# Patient Record
Sex: Female | Born: 1970
Health system: Southern US, Community
[De-identification: ages and names within clinical notes are randomized; demographics above are authoritative.]

## PROBLEM LIST (undated history)

## (undated) DIAGNOSIS — N920 Excessive and frequent menstruation with regular cycle: Secondary | ICD-10-CM

## (undated) DIAGNOSIS — N8003 Adenomyosis of the uterus: Secondary | ICD-10-CM

## (undated) DIAGNOSIS — N8000 Endometriosis of the uterus, unspecified: Secondary | ICD-10-CM

## (undated) DIAGNOSIS — E119 Type 2 diabetes mellitus without complications: Secondary | ICD-10-CM

## (undated) DIAGNOSIS — D509 Iron deficiency anemia, unspecified: Secondary | ICD-10-CM

## (undated) HISTORY — PX: FOOT SURGERY: SHX648

---

## 2011-06-02 ENCOUNTER — Ambulatory Visit (INDEPENDENT_AMBULATORY_CARE_PROVIDER_SITE_OTHER): Payer: BC Managed Care – HMO | Admitting: Obstetrics and Gynecology

## 2011-06-02 DIAGNOSIS — Z01419 Encounter for gynecological examination (general) (routine) without abnormal findings: Secondary | ICD-10-CM

## 2011-08-02 ENCOUNTER — Telehealth: Payer: Self-pay | Admitting: Obstetrics and Gynecology

## 2011-08-02 NOTE — Telephone Encounter (Signed)
Routed to triage/laura/?

## 2011-08-04 ENCOUNTER — Telehealth: Payer: Self-pay | Admitting: Obstetrics and Gynecology

## 2011-08-04 DIAGNOSIS — N979 Female infertility, unspecified: Secondary | ICD-10-CM

## 2011-08-04 NOTE — Telephone Encounter (Signed)
Pt called wanting an appt to see specialist that SR had recommended. Pt had consult with SR for reanastamosis and AMH.  Referral made to Dr Elesa Hacker.  Will call to make sure referral is r/c. Pt will call to check with them concening benefits. ld

## 2011-08-04 NOTE — Telephone Encounter (Signed)
Routed to laura/cld once/laura returned call

## 2011-08-05 NOTE — Telephone Encounter (Signed)
Please pull chart.

## 2011-09-09 DIAGNOSIS — I10 Essential (primary) hypertension: Secondary | ICD-10-CM | POA: Insufficient documentation

## 2011-09-09 DIAGNOSIS — E119 Type 2 diabetes mellitus without complications: Secondary | ICD-10-CM | POA: Insufficient documentation

## 2011-12-24 ENCOUNTER — Telehealth: Payer: Self-pay | Admitting: Obstetrics and Gynecology

## 2011-12-24 NOTE — Telephone Encounter (Signed)
SR pt 

## 2011-12-27 NOTE — Telephone Encounter (Signed)
LMTC @5  ld

## 2012-01-07 ENCOUNTER — Telehealth: Payer: Self-pay

## 2012-01-07 NOTE — Telephone Encounter (Signed)
Pt notified of cost breakdown of tubal reversal.  Pt also requesting payment options for the hospital.  Told to call Britta Mccreedy or Pam at [facility] for information.  Pt also asking if she needs to keep appointment with Dr Elesa Hacker before going through with reversal.  Will send chart to SR office for review.  ld

## 2012-01-19 NOTE — Telephone Encounter (Signed)
Patient is not a candidate for tubal reversal because of low AMH which is associated with poor egg reserve. Pt was referred to Dr Elesa Hacker to review her options at this time.

## 2012-01-21 ENCOUNTER — Telehealth: Payer: Self-pay

## 2012-01-21 NOTE — Telephone Encounter (Signed)
LM for pt that she is to f/u with Dr Elesa Hacker due to not being a good candidate for tubal reversal.  ld

## 2012-01-24 ENCOUNTER — Telehealth: Payer: Self-pay

## 2012-01-25 NOTE — Telephone Encounter (Signed)
Pt notified of AMH result.  ld

## 2013-09-05 ENCOUNTER — Encounter (HOSPITAL_COMMUNITY)
Admission: RE | Admit: 2013-09-05 | Discharge: 2013-09-05 | Disposition: A | Discharge status: Home / Self Care | Payer: BC Managed Care – PPO | Source: Ambulatory Visit | Attending: Obstetrics and Gynecology | Admitting: Obstetrics and Gynecology

## 2013-09-05 ENCOUNTER — Encounter (HOSPITAL_COMMUNITY): Payer: Self-pay

## 2013-09-05 DIAGNOSIS — Z01812 Encounter for preprocedural laboratory examination: Secondary | ICD-10-CM | POA: Insufficient documentation

## 2013-09-05 DIAGNOSIS — Z01810 Encounter for preprocedural cardiovascular examination: Secondary | ICD-10-CM | POA: Insufficient documentation

## 2013-09-05 LAB — BASIC METABOLIC PANEL WITH GFR
BUN: 9 mg/dL (ref 6–23)
CO2: 26 meq/L (ref 19–32)
Calcium: 9.1 mg/dL (ref 8.4–10.5)
Chloride: 92 meq/L — ABNORMAL LOW (ref 96–112)
Creatinine, Ser: 0.56 mg/dL (ref 0.50–1.10)
GFR calc Af Amer: >90 mL/min
GFR calc non Af Amer: >90 mL/min
Glucose, Bld: 321 mg/dL — ABNORMAL HIGH (ref 70–99)
Potassium: 3.9 meq/L (ref 3.7–5.3)
Sodium: 132 meq/L — ABNORMAL LOW (ref 137–147)

## 2013-09-05 LAB — CBC
HCT: 35 % — ABNORMAL LOW (ref 36.0–46.0)
Hemoglobin: 10 g/dL — ABNORMAL LOW (ref 12.0–15.0)
MCH: 21.6 pg — AB (ref 26.0–34.0)
MCHC: 28.6 g/dL — AB (ref 30.0–36.0)
MCV: 75.8 fL — AB (ref 78.0–100.0)
PLATELETS: 436 10*3/uL — AB (ref 150–400)
RBC: 4.62 MIL/uL (ref 3.87–5.11)
RDW: 22.1 % — AB (ref 11.5–15.5)
WBC: 6.3 10*3/uL (ref 4.0–10.5)

## 2013-09-05 NOTE — Patient Instructions (Addendum)
Your procedure is scheduled on: 09/11/13    Enter through the Main Entrance of [facility] at:6am Pick up the phone at the desk and dial [phone] and inform us of your arrival.  Please call this number if you have any problems the morning of surgery: [phone]  Remember: Do not eat food after midnight:Monday Take these medicines the morning of surgery with a SIP OF WATER:Lisinopril  Do not wear jewelry, make-up, or FINGER nail polish No metal in your hair or on your body. Do not wear lotions, powders, perfumes.  You may wear deodorant.  Do not bring valuables to the hospital. Contacts, dentures or bridgework may not be worn into surgery.  Leave suitcase in the car. After Surgery it may be brought to your room. For patients being admitted to the hospital, checkout time is 11:00am the day of discharge.    Patients discharged on the day of surgery will not be allowed to drive home.      Remember: Do not eat food or drink liquids, including water, after midnight:Monday   You may brush your teeth the morning of surgery.

## 2013-09-06 NOTE — H&P (Signed)
NAME:  [name] [name], [name] [name] NO.:  MEDICAL RECORD NO.:  [id number]  LOCATION:                                 FACILITY:  PHYSICIAN:  Juluis Mire, M.D.   DATE OF BIRTH:  [birth year]  DATE OF ADMISSION: DATE OF DISCHARGE:                             HISTORY & PHYSICAL   Date of her surgery is September 11, 2013, at [facility] here in [place].  The patient is a 43 year old, gravida 2, para 2 female who presents for laparoscopic-assisted vaginal hysterectomy.  Patient has been having cycles that are regular.  She has 11-12 days of flow with large clots, 5- 6 days being heavy, changing pads and tampons every hour and half with worsening dysmenorrhea.  She is sexually active who has had a previous bilateral tubal ligation.  She has had significant anemia associated with this.  She now presents for laparoscopic-assisted vaginal hysterectomy for management.  ALLERGIES:  She has no known drug allergies.  MEDICATIONS:  She is on metformin for diabetes as well as lisinopril for hypertension.  PAST MEDICAL HISTORY:  History of glucose intolerance and hypertension. Two prior cesarean sections, previous bilateral tubal ligation.  No other surgery is noted.  SOCIAL HISTORY:  No alcohol, tobacco use.  FAMILY HISTORY:  History of diabetes, otherwise unremarkable.  REVIEW OF SYSTEMS:  Noncontributory.  PHYSICAL EXAMINATION:  VITAL SIGNS:  The patient is afebrile.  Stable vital signs. HEENT:  The patient is normocephalic.  Pupils equal, round, and reactive to light and accommodation.  Extraocular movements were intact.  Sclerae and conjunctivae are clear.  Oropharynx clear. NECK:  No thyromegaly. BREASTS:  Not examined. LUNGS:  Clear. CARDIOVASCULAR:  Regular rate.  No murmurs or gallops.  No carotid bruits. ABDOMEN:  Benign.  Well-healed low-transverse incision. PELVIC:  Normal external genitalia.  Vaginal mucosa is clear.  Cervix unremarkable.  Uterus upper  limits of normal size.  Adnexa unremarkable. EXTREMITIES:  Trace edema. NEUROLOGIC:  Grossly within normal limits.  IMPRESSION: 1. Menorrhagia secondary to adenomyosis. 2. Glucose intolerance. 3. Hypertension.  PLAN OF MANAGEMENT:  The patient to undergo laparoscopic-assisted vaginal hysterectomy with removal of both fallopian tubes.  Alternatives have been discussed and declined.  The risks of surgery have been explained including the risk of infection.  The risk of hemorrhage that could require transfusion with the risk of AIDS or hepatitis.  Risk of injury to adjacent organs including bladder, bowel, ureters, that could require further exploratory surgery.  Risk of deep venous thrombosis and pulmonary embolus.  The patient expressed understanding of indications, risks and alternatives.     Juluis Mire, M.D.     JSM/MEDQ  D:  09/06/2013  T:  09/06/2013  Job:  [id number]

## 2013-09-06 NOTE — H&P (Signed)
  Patient nameHairston. Conleigh DICTATION#  [id number] CSN# [id number]  Juluis Mire, MD 09/06/2013 6:30 AM

## 2013-09-06 NOTE — Pre-Procedure Instructions (Signed)
Dr. Arby Barrette notified of lab results and advised that Dr. Arelia Sneddon be made aware of non- fasting glucose result (321). LMOM for [name] [name] in office and gave her Dr. Flonnie Overman message.

## 2013-09-11 ENCOUNTER — Ambulatory Visit (HOSPITAL_COMMUNITY)
Admission: RE | Admit: 2013-09-11 | Payer: BC Managed Care – PPO | Source: Ambulatory Visit | Admitting: Obstetrics and Gynecology

## 2013-09-11 ENCOUNTER — Encounter (HOSPITAL_COMMUNITY): Admission: RE | Payer: Self-pay | Source: Ambulatory Visit

## 2013-09-11 SURGERY — HYSTERECTOMY, VAGINAL, LAPAROSCOPY-ASSISTED
Anesthesia: General

## 2014-01-08 ENCOUNTER — Ambulatory Visit (HOSPITAL_COMMUNITY)
Admission: RE | Admit: 2014-01-08 | Payer: BC Managed Care – PPO | Source: Ambulatory Visit | Admitting: Obstetrics and Gynecology

## 2014-01-08 ENCOUNTER — Encounter (HOSPITAL_COMMUNITY): Admission: RE | Payer: Self-pay | Source: Ambulatory Visit

## 2014-01-08 SURGERY — HYSTERECTOMY, VAGINAL, LAPAROSCOPY-ASSISTED
Anesthesia: General

## 2014-02-22 ENCOUNTER — Encounter (HOSPITAL_BASED_OUTPATIENT_CLINIC_OR_DEPARTMENT_OTHER): Payer: Self-pay | Admitting: *Deleted

## 2014-02-22 NOTE — Progress Notes (Signed)
NPO AFTER MN. ARRIVE AT 0600. LAB WORK TO BE DONE 02-25-2014. CURRENT EKG IN CHART AND EPIC. REVIEWED [facility] GUIDELINES , WILL BRING MEDS.

## 2014-02-22 NOTE — H&P (Signed)
  Patient name [name] [name], [name] [name] DICTATION# [id number] CSN# [id number]  [name] [name],[name] Watt S, MD 02/22/2014 9:23 AM

## 2014-02-23 NOTE — H&P (Signed)
NAMJuanito Doom:  [name], [name]             ACCOUNT NO.:  [id number]  MEDICAL RECORD NO.:  [id number]  LOCATION:                                 FACILITY:  PHYSICIAN:  Juluis MireJohn S. Tonni Mansour, M.D.   DATE OF BIRTH:  1971/03/10  DATE OF ADMISSION:  03/04/2014 DATE OF DISCHARGE:                             HISTORY & PHYSICAL   DATE OF SURGERY:  March [facility] 2014[facility].  The patient is a 43 year old gravida 2, para 2 female presents for laparoscopic-assisted vaginal hysterectomy and removal of both fallopian tubes.  In relation to the present admission, the patient has been having trouble with increasing menstrual flow.  She describes cycles of being extremely heavy.  She has 11-12 days of flow.  She has large clots, 5-6 of these days are heavy.  She will change pads and tampons every hour and a half and does have worsening dysmenorrhea.  Her hemoglobin had been depressed to a value of 7.8.  She has been on iron sulfate supplementation with improvement.  Ultrasound evaluation was highly suggestive of adenomyosis.  Alternatives were discussed including use of birth control pills.  She is hypertensive, however, that may be contraindicated.  We discussed use of the IUD versus ablation versus hysterectomy.  She is admitted for the latter.  ALLERGIES:  In terms of allergies, no known drug allergies.  MEDICATIONS:  Metformin and lisinopril.  PAST MEDICAL HISTORY:  She does have a history of hypertension, glucose intolerance.  PAST SURGICAL HISTORY:  She has had 2 prior cesarean sections and 1 tubal ligation.  SOCIAL HISTORY:  No tobacco or alcohol use.  FAMILY HISTORY:  Noncontributory.  REVIEW OF SYSTEMS:  Noncontributory.  PHYSICAL EXAMINATION:  VITAL SIGNS:  The patient is afebrile.  Stable vital signs. HEENT:  The patient is normocephalic.  Pupils equal, round, and reactive to light and accommodation.  Extraocular movements were intact.   Sclerae and conjunctivae are clear.  Oropharynx clear. NECK:  Without thyromegaly. BREASTS:  Not examined. LUNGS:  Clear. CARDIOVASCULAR SYSTEM:  Regular rhythm rate.  No murmurs or gallops. ABDOMEN:  Benign.  No mass, organomegaly, or tenderness. PELVIC:  Normal external genitalia.  Vaginal mucosa is clear.  Cervix unremarkable.  Uterus upper limits, normal size.  Adnexa unremarkable. EXTREMITIES:  Trace edema. NEUROLOGIC:  Gross normal limits.  IMPRESSION:  Menorrhagia secondary to adenomyosis.  PLAN:  After discussion of options, the patient would go laparoscopic- assisted vaginal hysterectomy with removal of both fallopian tubes.  The nature of the procedure have been discussed.  The risks have been explained including the risk of infection.  The risk of hemorrhage that could require transfusion with the risk of AIDS or hepatitis.  Risk of injury to adjacent organs such as bladder, bowel, ureters that could require further exploratory surgery.  Risk of deep venous thrombosis and pulmonary embolus.  The patient does understand potential risks, indications, and alternatives.     Juluis MireJohn S. [name], M.D.     JSM/MEDQ  D:  02/22/2014  T:  02/22/2014  Job:  [id number]

## 2014-02-25 DIAGNOSIS — N946 Dysmenorrhea, unspecified: Secondary | ICD-10-CM | POA: Diagnosis not present

## 2014-02-25 DIAGNOSIS — I10 Essential (primary) hypertension: Secondary | ICD-10-CM | POA: Diagnosis not present

## 2014-02-25 DIAGNOSIS — E118 Type 2 diabetes mellitus with unspecified complications: Secondary | ICD-10-CM | POA: Diagnosis not present

## 2014-02-25 DIAGNOSIS — N920 Excessive and frequent menstruation with regular cycle: Secondary | ICD-10-CM | POA: Diagnosis not present

## 2014-02-25 DIAGNOSIS — D649 Anemia, unspecified: Secondary | ICD-10-CM | POA: Diagnosis not present

## 2014-02-25 DIAGNOSIS — N800 Endometriosis of uterus: Secondary | ICD-10-CM | POA: Diagnosis not present

## 2014-02-25 LAB — BASIC METABOLIC PANEL
ANION GAP: 18 — AB (ref 5–15)
BUN: 6 mg/dL (ref 6–23)
CO2: 21 meq/L (ref 19–32)
CREATININE: 0.51 mg/dL (ref 0.50–1.10)
Calcium: 9.6 mg/dL (ref 8.4–10.5)
Chloride: 98 mEq/L (ref 96–112)
GFR calc Af Amer: >90 mL/min (ref >90)
GFR calc non Af Amer: >90 mL/min (ref >90)
Glucose, Bld: 311 mg/dL — ABNORMAL HIGH (ref 70–99)
POTASSIUM: 3.9 meq/L (ref 3.7–5.3)
Sodium: 137 mEq/L (ref 137–147)

## 2014-02-25 LAB — CBC
HCT: 37.7 % (ref 36.0–46.0)
HEMOGLOBIN: 11.3 g/dL — AB (ref 12.0–15.0)
MCH: 25.4 pg — AB (ref 26.0–34.0)
MCHC: 30 g/dL (ref 30.0–36.0)
MCV: 84.7 fL (ref 78.0–100.0)
Platelets: 345 10*3/uL (ref 150–400)
RBC: 4.45 MIL/uL (ref 3.87–5.11)
RDW: 17.6 % — ABNORMAL HIGH (ref 11.5–15.5)
WBC: 7.7 10*3/uL (ref 4.0–10.5)

## 2014-02-25 LAB — HCG, SERUM, QUALITATIVE: Preg, Serum: NEGATIVE

## 2014-03-03 ENCOUNTER — Encounter (HOSPITAL_BASED_OUTPATIENT_CLINIC_OR_DEPARTMENT_OTHER): Payer: Self-pay | Admitting: Anesthesiology

## 2014-03-03 NOTE — Anesthesia Preprocedure Evaluation (Addendum)
Anesthesia Evaluation  Patient identified by MRN, date of birth, ID band Patient awake    Reviewed: Allergy & Precautions, H&P , NPO status , Patient's Chart, lab work & pertinent test results  Airway Mallampati: II  TM Distance: >3 FB Neck ROM: Full    Dental  (+)    Pulmonary neg pulmonary ROS,  breath sounds clear to auscultation  Pulmonary exam normal       Cardiovascular negative cardio ROS  Rhythm:Regular Rate:Normal     Neuro/Psych negative neurological ROS  negative psych ROS   GI/Hepatic negative GI ROS, Neg liver ROS,   Endo/Other  diabetes, Type 2, Oral Hypoglycemic AgentsGlucose 270  Renal/GU negative Renal ROS  negative genitourinary   Musculoskeletal negative musculoskeletal ROS (+)   Abdominal   Peds negative pediatric ROS (+)  Hematology  (+) anemia , Hgb 11.3   Anesthesia Other Findings   Reproductive/Obstetrics negative OB ROS                           Anesthesia Physical Anesthesia Plan  ASA: II  Anesthesia Plan: General   Post-op Pain Management:    Induction: Intravenous  Airway Management Planned: Oral ETT  Additional Equipment:   Intra-op Plan:   Post-operative Plan: Extubation in OR  Informed Consent: I have reviewed the patients History and Physical, chart, labs and discussed the procedure including the risks, benefits and alternatives for the proposed anesthesia with the patient or authorized representative who has indicated his/her understanding and acceptance.   Dental advisory given  Plan Discussed with: CRNA  Anesthesia Plan Comments:         Anesthesia Quick Evaluation

## 2014-03-04 ENCOUNTER — Ambulatory Visit (HOSPITAL_BASED_OUTPATIENT_CLINIC_OR_DEPARTMENT_OTHER): Payer: BC Managed Care – PPO | Admitting: Anesthesiology

## 2014-03-04 ENCOUNTER — Encounter (HOSPITAL_BASED_OUTPATIENT_CLINIC_OR_DEPARTMENT_OTHER)
Admission: RE | Disposition: A | Discharge status: Home / Self Care | Payer: Self-pay | Source: Ambulatory Visit | Attending: Obstetrics and Gynecology

## 2014-03-04 ENCOUNTER — Encounter (HOSPITAL_BASED_OUTPATIENT_CLINIC_OR_DEPARTMENT_OTHER): Payer: Self-pay

## 2014-03-04 ENCOUNTER — Ambulatory Visit (HOSPITAL_BASED_OUTPATIENT_CLINIC_OR_DEPARTMENT_OTHER)
Admission: RE | Admit: 2014-03-04 | Discharge: 2014-03-05 | Disposition: A | Discharge status: Home / Self Care | Payer: BC Managed Care – PPO | Source: Ambulatory Visit | Attending: Obstetrics and Gynecology | Admitting: Obstetrics and Gynecology

## 2014-03-04 DIAGNOSIS — I10 Essential (primary) hypertension: Secondary | ICD-10-CM | POA: Insufficient documentation

## 2014-03-04 DIAGNOSIS — N920 Excessive and frequent menstruation with regular cycle: Secondary | ICD-10-CM | POA: Diagnosis not present

## 2014-03-04 DIAGNOSIS — Z90710 Acquired absence of both cervix and uterus: Secondary | ICD-10-CM | POA: Diagnosis not present

## 2014-03-04 DIAGNOSIS — D649 Anemia, unspecified: Secondary | ICD-10-CM | POA: Insufficient documentation

## 2014-03-04 DIAGNOSIS — N946 Dysmenorrhea, unspecified: Secondary | ICD-10-CM | POA: Insufficient documentation

## 2014-03-04 DIAGNOSIS — E118 Type 2 diabetes mellitus with unspecified complications: Secondary | ICD-10-CM | POA: Insufficient documentation

## 2014-03-04 DIAGNOSIS — N800 Endometriosis of uterus: Secondary | ICD-10-CM | POA: Insufficient documentation

## 2014-03-04 HISTORY — DX: Iron deficiency anemia, unspecified: D50.9

## 2014-03-04 HISTORY — PX: LAPAROSCOPIC ASSISTED VAGINAL HYSTERECTOMY: SHX5398

## 2014-03-04 HISTORY — DX: Type 2 diabetes mellitus without complications: E11.9

## 2014-03-04 HISTORY — DX: Endometriosis of the uterus, unspecified: N80.00

## 2014-03-04 HISTORY — DX: Endometriosis of uterus: N80.0

## 2014-03-04 HISTORY — DX: Adenomyosis of the uterus: N80.03

## 2014-03-04 HISTORY — DX: Excessive and frequent menstruation with regular cycle: N92.0

## 2014-03-04 LAB — GLUCOSE, CAPILLARY
GLUCOSE-CAPILLARY: 188 mg/dL — AB (ref 70–99)
Glucose-Capillary: 270 mg/dL — ABNORMAL HIGH (ref 70–99)

## 2014-03-04 SURGERY — HYSTERECTOMY, VAGINAL, LAPAROSCOPY-ASSISTED
Anesthesia: General | Site: Vagina

## 2014-03-04 MED ORDER — METFORMIN HCL 500 MG PO TABS
1000.0000 mg | ORAL_TABLET | Freq: Two times a day (BID) | ORAL | Status: DC
  Filled 2014-03-04: qty 2

## 2014-03-04 MED ORDER — MIDAZOLAM HCL 2 MG/2ML IJ SOLN
INTRAMUSCULAR | Status: AC
  Filled 2014-03-04: qty 2

## 2014-03-04 MED ORDER — GLYCOPYRROLATE 0.2 MG/ML IJ SOLN
INTRAMUSCULAR | Status: DC | PRN
  Administered 2014-03-04: 0.6 mg via INTRAVENOUS

## 2014-03-04 MED ORDER — LACTATED RINGERS IV SOLN
INTRAVENOUS | Status: DC
  Administered 2014-03-04 (×3): via INTRAVENOUS
  Filled 2014-03-04: qty 1000

## 2014-03-04 MED ORDER — ONDANSETRON HCL 4 MG/2ML IJ SOLN
4.0000 mg | Freq: Four times a day (QID) | INTRAMUSCULAR | Status: DC | PRN
  Filled 2014-03-04: qty 2

## 2014-03-04 MED ORDER — DIPHENHYDRAMINE HCL 50 MG/ML IJ SOLN
12.5000 mg | Freq: Four times a day (QID) | INTRAMUSCULAR | Status: DC | PRN
  Filled 2014-03-04: qty 0.25

## 2014-03-04 MED ORDER — MIDAZOLAM HCL 5 MG/5ML IJ SOLN
INTRAMUSCULAR | Status: DC | PRN
  Administered 2014-03-04: 2 mg via INTRAVENOUS

## 2014-03-04 MED ORDER — ONDANSETRON HCL 4 MG/2ML IJ SOLN
4.0000 mg | Freq: Four times a day (QID) | INTRAMUSCULAR | Status: DC | PRN
  Administered 2014-03-04 – 2014-03-05 (×2): 4 mg via INTRAVENOUS
  Filled 2014-03-04: qty 2

## 2014-03-04 MED ORDER — LIDOCAINE HCL 4 % MT SOLN
OROMUCOSAL | Status: DC | PRN
  Administered 2014-03-04: 2 mL via TOPICAL

## 2014-03-04 MED ORDER — HYDROMORPHONE 0.3 MG/ML IV SOLN
INTRAVENOUS | Status: DC
  Administered 2014-03-04: 2.13 mg via INTRAVENOUS
  Administered 2014-03-04: 12:00:00 via INTRAVENOUS
  Administered 2014-03-04: 1.39 mg via INTRAVENOUS
  Administered 2014-03-05: 0.4 mg via INTRAVENOUS
  Filled 2014-03-04 (×2): qty 25

## 2014-03-04 MED ORDER — ONDANSETRON HCL 4 MG/2ML IJ SOLN
INTRAMUSCULAR | Status: AC
  Filled 2014-03-04: qty 2

## 2014-03-04 MED ORDER — PROMETHAZINE HCL 25 MG/ML IJ SOLN
6.2500 mg | INTRAMUSCULAR | Status: DC | PRN
  Filled 2014-03-04: qty 1

## 2014-03-04 MED ORDER — LISINOPRIL 5 MG PO TABS
5.0000 mg | ORAL_TABLET | Freq: Every morning | ORAL | Status: DC
  Filled 2014-03-04: qty 1

## 2014-03-04 MED ORDER — LIDOCAINE HCL (CARDIAC) 20 MG/ML IV SOLN
INTRAVENOUS | Status: DC | PRN
  Administered 2014-03-04: 50 mg via INTRAVENOUS

## 2014-03-04 MED ORDER — FENTANYL CITRATE 0.05 MG/ML IJ SOLN
INTRAMUSCULAR | Status: AC
  Filled 2014-03-04: qty 2

## 2014-03-04 MED ORDER — CEFAZOLIN SODIUM-DEXTROSE 2-3 GM-% IV SOLR
INTRAVENOUS | Status: AC
  Filled 2014-03-04: qty 50

## 2014-03-04 MED ORDER — ONDANSETRON HCL 4 MG PO TABS
4.0000 mg | ORAL_TABLET | Freq: Four times a day (QID) | ORAL | Status: DC | PRN
  Filled 2014-03-04: qty 1

## 2014-03-04 MED ORDER — ACETAMINOPHEN 10 MG/ML IV SOLN
INTRAVENOUS | Status: DC | PRN
  Administered 2014-03-04: 1000 mg via INTRAVENOUS

## 2014-03-04 MED ORDER — NEOSTIGMINE METHYLSULFATE 10 MG/10ML IV SOLN
INTRAVENOUS | Status: DC | PRN
  Administered 2014-03-04: 4 mg via INTRAVENOUS

## 2014-03-04 MED ORDER — DIPHENHYDRAMINE HCL 12.5 MG/5ML PO ELIX
12.5000 mg | ORAL_SOLUTION | Freq: Four times a day (QID) | ORAL | Status: DC | PRN
  Filled 2014-03-04: qty 5

## 2014-03-04 MED ORDER — CEFAZOLIN SODIUM-DEXTROSE 2-3 GM-% IV SOLR
2.0000 g | INTRAVENOUS | Status: AC
  Administered 2014-03-04: 2 g via INTRAVENOUS
  Filled 2014-03-04: qty 50

## 2014-03-04 MED ORDER — NALOXONE HCL 0.4 MG/ML IJ SOLN
0.4000 mg | INTRAMUSCULAR | Status: DC | PRN
  Filled 2014-03-04: qty 1

## 2014-03-04 MED ORDER — OXYCODONE-ACETAMINOPHEN 5-325 MG PO TABS
1.0000 | ORAL_TABLET | ORAL | Status: DC | PRN
  Filled 2014-03-04: qty 2

## 2014-03-04 MED ORDER — HYDROMORPHONE HCL 1 MG/ML IJ SOLN
0.2500 mg | INTRAMUSCULAR | Status: DC | PRN
  Filled 2014-03-04: qty 1

## 2014-03-04 MED ORDER — LACTATED RINGERS IR SOLN
Status: DC | PRN
  Administered 2014-03-04: 3000 mL

## 2014-03-04 MED ORDER — ROCURONIUM BROMIDE 100 MG/10ML IV SOLN
INTRAVENOUS | Status: DC | PRN
  Administered 2014-03-04: 45 mg via INTRAVENOUS
  Administered 2014-03-04: 5 mg via INTRAVENOUS

## 2014-03-04 MED ORDER — ACETAMINOPHEN 325 MG PO TABS
650.0000 mg | ORAL_TABLET | ORAL | Status: DC | PRN
  Filled 2014-03-04: qty 2

## 2014-03-04 MED ORDER — PROPOFOL 10 MG/ML IV BOLUS
INTRAVENOUS | Status: DC | PRN
  Administered 2014-03-04: 150 mg via INTRAVENOUS
  Administered 2014-03-04 (×3): 50 mg via INTRAVENOUS

## 2014-03-04 MED ORDER — LACTATED RINGERS IV SOLN
INTRAVENOUS | Status: DC
  Administered 2014-03-04: via INTRAVENOUS
  Filled 2014-03-04: qty 1000

## 2014-03-04 MED ORDER — BUPIVACAINE HCL (PF) 0.25 % IJ SOLN
INTRAMUSCULAR | Status: DC | PRN
  Administered 2014-03-04: 9 mL

## 2014-03-04 MED ORDER — SODIUM CHLORIDE 0.9 % IJ SOLN
9.0000 mL | INTRAMUSCULAR | Status: DC | PRN
  Filled 2014-03-04: qty 9

## 2014-03-04 MED ORDER — FENTANYL CITRATE 0.05 MG/ML IJ SOLN
INTRAMUSCULAR | Status: DC | PRN
  Administered 2014-03-04: 100 ug via INTRAVENOUS
  Administered 2014-03-04 (×2): 50 ug via INTRAVENOUS

## 2014-03-04 MED ORDER — ONDANSETRON HCL 4 MG/2ML IJ SOLN
INTRAMUSCULAR | Status: DC | PRN
  Administered 2014-03-04: 4 mg via INTRAVENOUS

## 2014-03-04 MED ORDER — INSULIN ASPART 100 UNIT/ML ~~LOC~~ SOLN
7.0000 [IU] | Freq: Once | SUBCUTANEOUS | Status: AC
  Administered 2014-03-04: 7 [IU] via SUBCUTANEOUS
  Filled 2014-03-04: qty 0.07

## 2014-03-04 MED ORDER — FENTANYL CITRATE 0.05 MG/ML IJ SOLN
INTRAMUSCULAR | Status: AC
  Filled 2014-03-04: qty 4

## 2014-03-04 MED ORDER — FENTANYL CITRATE 0.05 MG/ML IJ SOLN
25.0000 ug | INTRAMUSCULAR | Status: AC | PRN
  Administered 2014-03-04 (×6): 25 ug via INTRAVENOUS
  Filled 2014-03-04: qty 0.5

## 2014-03-04 MED ORDER — MENTHOL 3 MG MT LOZG
1.0000 | LOZENGE | OROMUCOSAL | Status: DC | PRN
  Filled 2014-03-04: qty 9

## 2014-03-04 SURGICAL SUPPLY — 88 items
APPLICATOR COTTON TIP 6IN STRL (MISCELLANEOUS) ×3 IMPLANT
BAG URINE DRAINAGE (UROLOGICAL SUPPLIES) ×3 IMPLANT
BALLN HASSAN TROCAR (MISCELLANEOUS) IMPLANT
BANDAGE ADH SHEER 1  50/CT (GAUZE/BANDAGES/DRESSINGS) ×3 IMPLANT
BANDAGE ADHESIVE 1X3 (GAUZE/BANDAGES/DRESSINGS) IMPLANT
BLADE CLIPPER SURG (BLADE) ×3 IMPLANT
BLADE SURG 10 STRL SS (BLADE) ×6 IMPLANT
BLADE SURG 11 STRL SS (BLADE) ×6 IMPLANT
CANISTER SUCTION 2500CC (MISCELLANEOUS) ×6 IMPLANT
CATH FOLEY 2WAY SLVR  5CC 14FR (CATHETERS) ×2
CATH FOLEY 2WAY SLVR 5CC 14FR (CATHETERS) ×1 IMPLANT
CATH ROBINSON RED A/P 16FR (CATHETERS) ×3 IMPLANT
CLOSURE WOUND 1/4X4 (GAUZE/BANDAGES/DRESSINGS)
COVER LIGHT HANDLE  1/PK (MISCELLANEOUS) ×4
COVER LIGHT HANDLE 1/PK (MISCELLANEOUS) ×2 IMPLANT
COVER MAYO STAND STRL (DRAPES) ×3 IMPLANT
COVER TABLE BACK 60X90 (DRAPES) ×6 IMPLANT
DERMABOND ADVANCED (GAUZE/BANDAGES/DRESSINGS) ×2
DERMABOND ADVANCED .7 DNX12 (GAUZE/BANDAGES/DRESSINGS) ×1 IMPLANT
DRAPE CAMERA CLOSED 9X96 (DRAPES) ×3 IMPLANT
DRAPE LG THREE QUARTER DISP (DRAPES) ×3 IMPLANT
DRAPE UNDERBUTTOCKS STRL (DRAPE) ×3 IMPLANT
DRSG TEGADERM 2-3/8X2-3/4 SM (GAUZE/BANDAGES/DRESSINGS) ×3 IMPLANT
DRSG TEGADERM 4X4.75 (GAUZE/BANDAGES/DRESSINGS) ×3 IMPLANT
ELECT REM PT RETURN 9FT ADLT (ELECTROSURGICAL) ×3
ELECTRODE REM PT RTRN 9FT ADLT (ELECTROSURGICAL) ×1 IMPLANT
FILTER SMOKE EVAC LAPAROSHD (FILTER) IMPLANT
GLOVE BIO SURGEON STRL SZ7 (GLOVE) ×12 IMPLANT
GLOVE BIOGEL M 6.5 STRL (GLOVE) ×12 IMPLANT
GLOVE BIOGEL PI IND STRL 6.5 (GLOVE) ×4 IMPLANT
GLOVE BIOGEL PI IND STRL 7.5 (GLOVE) ×1 IMPLANT
GLOVE BIOGEL PI INDICATOR 6.5 (GLOVE) ×8
GLOVE BIOGEL PI INDICATOR 7.5 (GLOVE) ×2
GOWN PREVENTION PLUS LG XLONG (DISPOSABLE) IMPLANT
GOWN STRL REIN XL XLG (GOWN DISPOSABLE) IMPLANT
GOWN STRL REUS W/TWL LRG LVL3 (GOWN DISPOSABLE) ×9 IMPLANT
GOWN STRL REUS W/TWL XL LVL3 (GOWN DISPOSABLE) ×3 IMPLANT
HOLDER FOLEY CATH W/STRAP (MISCELLANEOUS) ×3 IMPLANT
IV LACTATED RINGER IRRG 3000ML (IV SOLUTION) ×2
IV LR IRRIG 3000ML ARTHROMATIC (IV SOLUTION) ×1 IMPLANT
NEEDLE HYPO 22GX1.5 SAFETY (NEEDLE) ×3 IMPLANT
NEEDLE INSUFFLATION 14GA 120MM (NEEDLE) IMPLANT
NEEDLE INSUFFLATION 14GA 150MM (NEEDLE) IMPLANT
NS IRRIG 500ML POUR BTL (IV SOLUTION) ×3 IMPLANT
PACK BASIN DAY SURGERY FS (CUSTOM PROCEDURE TRAY) ×3 IMPLANT
PAD OB MATERNITY 4.3X12.25 (PERSONAL CARE ITEMS) ×3 IMPLANT
PAD PREP 24X48 CUFFED NSTRL (MISCELLANEOUS) ×3 IMPLANT
PENCIL BUTTON HOLSTER BLD 10FT (ELECTRODE) ×3 IMPLANT
POUCH SPECIMEN RETRIEVAL 10MM (ENDOMECHANICALS) IMPLANT
SCISSORS LAP 5X35 DISP (ENDOMECHANICALS) IMPLANT
SEALER TISSUE G2 CVD JAW 45CM (ENDOMECHANICALS) ×3 IMPLANT
SET IRRIG TUBING LAPAROSCOPIC (IRRIGATION / IRRIGATOR) ×3 IMPLANT
SET IRRIG Y TYPE TUR BLADDER L (SET/KITS/TRAYS/PACK) IMPLANT
SHEET LAVH (DRAPES) ×3 IMPLANT
SOLUTION ANTI FOG 6CC (MISCELLANEOUS) ×3 IMPLANT
SOLUTION ELECTROLUBE (MISCELLANEOUS) IMPLANT
SPONGE GAUZE 2X2 8PLY STER LF (GAUZE/BANDAGES/DRESSINGS)
SPONGE GAUZE 2X2 8PLY STRL LF (GAUZE/BANDAGES/DRESSINGS) IMPLANT
SPONGE GAUZE 4X4 12PLY STER LF (GAUZE/BANDAGES/DRESSINGS) ×3 IMPLANT
SPONGE LAP 4X18 X RAY DECT (DISPOSABLE) ×3 IMPLANT
STRIP CLOSURE SKIN 1/4X4 (GAUZE/BANDAGES/DRESSINGS) IMPLANT
SUT MON AB 2-0 CT1 36 (SUTURE) ×3 IMPLANT
SUT VIC AB 0 CT1 18XCR BRD8 (SUTURE) ×2 IMPLANT
SUT VIC AB 0 CT1 36 (SUTURE) ×6 IMPLANT
SUT VIC AB 0 CT1 8-18 (SUTURE) ×4
SUT VIC AB 2-0 SH 27 (SUTURE)
SUT VIC AB 2-0 SH 27XBRD (SUTURE) IMPLANT
SUT VIC AB 3-0 SH 27 (SUTURE)
SUT VIC AB 3-0 SH 27X BRD (SUTURE) IMPLANT
SUT VICRYL 0 AB UR-6 (SUTURE) ×3 IMPLANT
SUT VICRYL 0 UR6 27IN ABS (SUTURE) IMPLANT
SUT VICRYL 1 TIES 12X18 (SUTURE) ×3 IMPLANT
SUT VICRYL 4-0 PS2 18IN ABS (SUTURE) ×3 IMPLANT
SYR BULB IRRIGATION 50ML (SYRINGE) ×3 IMPLANT
SYR CONTROL 10ML LL (SYRINGE) ×3 IMPLANT
SYRINGE 10CC LL (SYRINGE) ×3 IMPLANT
TOWEL OR 17X24 6PK STRL BLUE (TOWEL DISPOSABLE) ×6 IMPLANT
TRAY DSU PREP LF (CUSTOM PROCEDURE TRAY) ×3 IMPLANT
TROCAR BALLN 12MMX100 BLUNT (TROCAR) ×3 IMPLANT
TROCAR OPTI TIP 5M 100M (ENDOMECHANICALS) ×3 IMPLANT
TROCAR XCEL DIL TIP R 11M (ENDOMECHANICALS) IMPLANT
TUBE CONNECTING 12'X1/4 (SUCTIONS) ×2
TUBE CONNECTING 12X1/4 (SUCTIONS) ×4 IMPLANT
TUBING INSUFFLATION 10FT LAP (TUBING) ×3 IMPLANT
VACUUM HOSE/TUBING 7/8INX6FT (MISCELLANEOUS) IMPLANT
WARMER LAPAROSCOPE (MISCELLANEOUS) ×3 IMPLANT
WATER STERILE IRR 500ML POUR (IV SOLUTION) ×3 IMPLANT
YANKAUER SUCT BULB TIP NO VENT (SUCTIONS) ×3 IMPLANT

## 2014-03-04 NOTE — H&P (Signed)
  History and physical exam unchangedHistory and physical exam unchangedHistory and physical exam unchanged

## 2014-03-04 NOTE — Progress Notes (Signed)
Patient ID: [name] [name], female   DOB: 04/15/1970, 43 y.o.   MRN: [id number] AF VSS ABD SOFT INC DRY GOOD UO

## 2014-03-04 NOTE — Transfer of Care (Signed)
Immediate Anesthesia Transfer of Care Note  Patient: [name] [name]  Procedure(s) Performed: Procedure(s) with comments: LAPAROSCOPIC ASSISTED VAGINAL HYSTERECTOMY (N/A) - and abdomen ports  Patient Location: PACU  Anesthesia Type:General  Level of Consciousness: awake, alert , oriented and patient cooperative  Airway & Oxygen Therapy: Patient Spontanous Breathing and Patient connected to nasal cannula oxygen  Post-op Assessment: Report given to PACU RN and Post -op Vital signs reviewed and stable  Post vital signs: Reviewed and stable  Complications: No apparent anesthesia complications

## 2014-03-04 NOTE — Progress Notes (Signed)
Attempted to get patient up to ambulate,patient stated she did not feel like ambulating at this time. RN explained the importance of getting OOB, patient verbalized understanding and said she would like to wait about an hour. Will continue to monitor patient,patient instructed to call if needed. C.Essa Wenk,RN

## 2014-03-04 NOTE — Anesthesia Postprocedure Evaluation (Signed)
  Anesthesia Post-op Note  Patient: [name] [name]  Procedure(s) Performed: Procedure(s) (LRB): LAPAROSCOPIC ASSISTED VAGINAL HYSTERECTOMY (N/A)  Patient Location: PACU  Anesthesia Type: General  Level of Consciousness: awake and alert   Airway and Oxygen Therapy: Patient Spontanous Breathing  Post-op Pain: mild  Post-op Assessment: Post-op Vital signs reviewed, Patient's Cardiovascular Status Stable, Respiratory Function Stable, Patent Airway and No signs of Nausea or vomiting  Last Vitals:  Filed Vitals:   03/04/14 1015  BP: 128/70  Pulse: 70  Temp:   Resp: 14    Post-op Vital Signs: stable   Complications: No apparent anesthesia complications. Blood sugar improved after one dose subQ insulin.

## 2014-03-04 NOTE — Brief Op Note (Signed)
03/04/2014  9:01 AM  PATIENT:  [name] [name]  43 y.o. female  PRE-OPERATIVE DIAGNOSIS:  MENORRHAGIA, DYSMENORRHEA  POST-OPERATIVE DIAGNOSIS:  MENORRHAGIA, DYSMENORRHEA  PROCEDURE:  Procedure(s) with comments: LAPAROSCOPIC ASSISTED VAGINAL HYSTERECTOMY (N/A) - and abdomen ports  SURGEON:  Surgeon(s) and Role:    * Juluis MireJohn S Jessicah Croll, MD - Primary    * Mitchel HonourMegan Morris, DO - Assisting  PHYSICIAN ASSISTANT:   ASSISTANTS: morris    ANESTHESIA:   local and general  EBL:  Total I/O In: 1000 [I.V.:1000] Out: 300 [Urine:100; Blood:200]  BLOOD ADMINISTERED:none  DRAINS: Urinary Catheter (Foley)   LOCAL MEDICATIONS USED:  MARCAINE     SPECIMEN:  Source of Specimen:  uterus and both fallopian tubes  DISPOSITION OF SPECIMEN:  PATHOLOGY  COUNTS:  YES  TOURNIQUET:  * No tourniquets in log *  DICTATION: .Other Dictation: Dictation Number [id number]  PLAN OF CARE: Admit for overnight observation  PATIENT DISPOSITION:  PACU - hemodynamically stable.   Delay start of Pharmacological VTE agent (>24hrs) due to surgical blood loss or risk of bleeding: no

## 2014-03-04 NOTE — Anesthesia Procedure Notes (Signed)
Procedure Name: Intubation Date/Time: 03/04/2014 7:31 AM Performed by: Wanita Chamberlain Pre-anesthesia Checklist: Patient identified, Emergency Drugs available, Suction available, Patient being monitored and Timeout performed Patient Re-evaluated:Patient Re-evaluated prior to inductionOxygen Delivery Method: Circle system utilized Preoxygenation: Pre-oxygenation with 100% oxygen Intubation Type: IV induction Ventilation: Mask ventilation without difficulty Laryngoscope Size: Mac and 3 Grade View: Grade I Tube type: Oral Tube size: 7.0 mm Number of attempts: 1 Airway Equipment and Method: Bite block,  Stylet and LTA kit utilized Placement Confirmation: ETT inserted through vocal cords under direct vision,  positive ETCO2 and breath sounds checked- equal and bilateral Secured at: 20 cm Tube secured with: Tape Dental Injury: Teeth and Oropharynx as per pre-operative assessment

## 2014-03-05 ENCOUNTER — Encounter (HOSPITAL_BASED_OUTPATIENT_CLINIC_OR_DEPARTMENT_OTHER): Payer: Self-pay | Admitting: Obstetrics and Gynecology

## 2014-03-05 DIAGNOSIS — N920 Excessive and frequent menstruation with regular cycle: Secondary | ICD-10-CM | POA: Diagnosis not present

## 2014-03-05 LAB — CBC
HCT: 32.8 % — ABNORMAL LOW (ref 36.0–46.0)
HEMOGLOBIN: 10 g/dL — AB (ref 12.0–15.0)
MCH: 25.5 pg — ABNORMAL LOW (ref 26.0–34.0)
MCHC: 30.5 g/dL (ref 30.0–36.0)
MCV: 83.7 fL (ref 78.0–100.0)
Platelets: 312 10*3/uL (ref 150–400)
RBC: 3.92 MIL/uL (ref 3.87–5.11)
RDW: 18.7 % — ABNORMAL HIGH (ref 11.5–15.5)
WBC: 10.7 10*3/uL — ABNORMAL HIGH (ref 4.0–10.5)

## 2014-03-05 LAB — GLUCOSE, CAPILLARY: Glucose-Capillary: 228 mg/dL — ABNORMAL HIGH (ref 70–99)

## 2014-03-05 MED ORDER — OXYCODONE-ACETAMINOPHEN 7.5-325 MG PO TABS: 1.0000 | ORAL_TABLET | ORAL | Status: DC | PRN

## 2014-03-05 MED ORDER — ONDANSETRON HCL 4 MG/2ML IJ SOLN
INTRAMUSCULAR | Status: AC
  Filled 2014-03-05: qty 2

## 2014-03-05 NOTE — Discharge Summary (Signed)
  Patient name [name] [name], [name] [name] DICTATION# [id number] CSN# [id number]  [facility]vd[facility]CCOMB,Addeline Calarco S, MD 03/05/2014 7:30 AM

## 2014-03-05 NOTE — Discharge Summary (Signed)
NAMJuanito Doom:  [name], [name]             ACCOUNT NO.:  [id number]  MEDICAL RECORD NO.:  [id number]  LOCATION:                                 FACILITY:  PHYSICIAN:  Juluis MireJohn S. Tyla Burgner, M.D.   DATE OF BIRTH:  [birth year]  DATE OF ADMISSION:  03/04/2014 DATE OF DISCHARGE:  03/05/2014                              DISCHARGE SUMMARY   ADMITTING DIAGNOSIS:  Menorrhagia secondary to adenomyosis.  DISCHARGE DIAGNOSIS:  Menorrhagia secondary to adenomyosis.  OPERATIVE PROCEDURE:  Laparoscopic-assisted vaginal hysterectomy with removal of both fallopian tubes.  For complete History and Physical, please see dictated note.  HOSPITAL COURSE:  The patient underwent above-noted surgery.  She was observed overnight, did quite well.  Her hemoglobin was 10 the following day.  She had no nausea or vomiting in that morning.  She had good clear urine output.  Incisions were all intact and she has had minimal spotting.  She will be discharged home at this time.  In terms of complication, none were encountered during her stay in the hospital.  The patient was discharged home in stable condition.  DISPOSITION:  The patient continued medications at home including her blood pressure medicine and metformin.  Several blood sugars have been elevated.  We will probably follow those up postop.  She was sent home with Percocet that she needed for pain.  I will see her back in the office in approximately a week.  She is instructed to call if there is any signs of fever, nausea, vomiting, excessive vaginal bleeding or excessive pain.  Also instructed on signs and symptoms of deep venous thrombosis and pulmonary embolus.     Juluis MireJohn S. Blanchard Willhite, M.D.     JSM/MEDQ  D:  03/05/2014  T:  03/05/2014  Job:  [id number]

## 2014-03-05 NOTE — Discharge Instructions (Signed)
Laparoscopically Assisted Vaginal Hysterectomy  A laparoscopically assisted vaginal hysterectomy (LAVH) is a surgical procedure to remove the uterus and cervix, and sometimes the ovaries and fallopian tubes. During an LAVH, some of the surgical removal is done through the vagina, and the rest is done through a few small surgical cuts (incisions) in the abdomen.  This procedure is usually considered in women when a vaginal hysterectomy is not an option. Your health care provider will discuss the risks and benefits of the different surgical techniques at your appointment. Generally, recovery time is faster and there are fewer complications after laparoscopic procedures than after open incisional procedures. LET YOUR HEALTH CARE PROVIDER KNOW ABOUT:   Any allergies you have.  All medicines you are taking, including vitamins, herbs, eye drops, creams, and over-the-counter medicines.  Previous problems you or members of your family have had with the use of anesthetics.  Any blood disorders you have.  Previous surgeries you have had.  Medical conditions you have. RISKS AND COMPLICATIONS Generally, this is a safe procedure. However, as with any procedure, complications can occur. Possible complications include:  Allergies to medicines.  Difficulty breathing.  Bleeding.  Infection.  Damage to other structures near your uterus and cervix. BEFORE THE PROCEDURE  Ask your health care provider about changing or stopping your regular medicines.  Take certain medicines, such as a colon-emptying preparation, as directed.  Do not eat or drink anything for at least 8 hours before your surgery.  Stop smoking if you smoke. Stopping will improve your health after surgery.  Arrange for a ride home after surgery and for help at home during recovery. PROCEDURE   An IV tube will be put into one of your veins in order to give you fluids and medicines.  You will receive medicines to relax you and  medicines that make you sleep (general anesthetic).  You may have a flexible tube (catheter) put into your bladder to drain urine.  You may have a tube put through your nose or mouth that goes into your stomach (nasogastric tube). The nasogastric tube removes digestive fluids and prevents you from feeling nauseated and from vomiting.  Tight-fitting (compression) stockings will be placed on your legs to promote circulation.  Three to four small incisions will be made in your abdomen. An incision also will be made in your vagina. Probes and tools will be inserted into the small incisions. The uterus and cervix are removed (and possibly your ovaries and fallopian tubes) through your vagina as well as through the small incisions that were made in the abdomen.  Your vagina is then sewn back to normal. AFTER THE PROCEDURE  You may have a liquid diet temporarily. You will most likely return to, and tolerate, your usual diet the day after surgery.  You will be passing urine through a catheter. It will be removed the day after surgery.  Your temperature, breathing rate, heart rate, blood pressure, and oxygen level will be monitored regularly.  You will still wear compression stockings on your legs until you are able to move around.  You will use a special device or do breathing exercises to keep your lungs clear.  You will be encouraged to walk as soon as possible. Document Released: 03/11/2011 Document Revised: 11/22/2012 Document Reviewed: 10/05/2012 Exit[employer] Patient Information 2015 [employer]. This information is not intended to replace advice given to you by your health care provider. Make sure you discuss any questions you have with your health care provider.  

## 2014-03-05 NOTE — Progress Notes (Signed)
1 Day Post-Op Procedure(s) (LRB): LAPAROSCOPIC ASSISTED VAGINAL HYSTERECTOMY (N/A)  Subjective: Patient reports incisional pain and tolerating PO.    Objective: I have reviewed patient's vital signs and labs.  General: alert GI: soft, non-tender; bowel sounds normal; no masses,  no organomegaly and incision: clean Vaginal Bleeding: minimal  Assessment: s/p Procedure(s) with comments: LAPAROSCOPIC ASSISTED VAGINAL HYSTERECTOMY (N/A) - and abdomen ports: stable  Plan: Discharge home  LOS: 1 day    Sina Lucchesi S 03/05/2014, 7:26 AM

## 2014-03-05 NOTE — Op Note (Signed)
[name] [name]:  [name], [name]             ACCOUNT NO.:  [id number]  MEDICAL RECORD NO.:  [id number]  LOCATION:                                 FACILITY:  PHYSICIAN:  Juluis MireJohn S. [place], M.D.   DATE OF BIRTH:  [birth year]  DATE OF PROCEDURE:  03/04/2014 DATE OF DISCHARGE:  03/05/2014                              OPERATIVE REPORT   PREOPERATIVE DIAGNOSIS:  Menorrhagia secondary to adenomyosis.  POSTOPERATIVE DIAGNOSIS:  Menorrhagia secondary to adenomyosis.  PROCEDURE:  Laparoscopic-assisted vaginal hysterectomy with surgical removal of both fallopian tubes.  SURGEON:  Juluis MireJohn S. Diyan Dave, M.D.  ASSISTANT:  Balinda QuailsMegan Morse.  ESTIMATED BLOOD LOSS:  500-600 mL.  PACKS:  None.  DRAINS:  Included urethral Foley.  COMPLICATIONS:  None.  INDICATIONS:  Dictated in history and physical.  DESCRIPTION OF PROCEDURE:  The patient was taken to the OR and placed in the supine position.  After satisfactory level of general anesthesia obtained, the patient was placed in a dorsal lithotomy position using the Allen stirrups.  The abdomen, perineum and vagina were prepped out with Betadine.  Bladder was emptied by in and out catheterization.  A Hulka tenaculum was put in place and secured.  Speculum was then removed from the vaginal vault.  The patient was then draped in sterile field. Subumbilical incision was made with knife and carried through the subcutaneous tissue.  Fascia was identified and elevated.  An incision was made and the fascia was extended laterally.  Muscles were separated in the midline.  Peritoneum was entered with blunt finger pressure. There were some omental adhesions noted to the left of the umbilical incision.  There were no evidence of any bowel injury.  The open laparoscopic trocar was put in place and secured.  The abdomen was inflated with carbon dioxide.  Laparoscope was introduced.  The upper abdomen including liver and tip of the gallbladder were clear.  The appendix must be on  retrocecal not visualize.  There was no evidence of injury to adjacent organs.  A 5-mm trocar was put in place in suprapubic area.  Visualization did reveal some adhesions from the lower uterine segment to the anterior abdominal wall.  These were taken down using the EnSeal.  We then went to the right fallopian tube, elevated it.  We using the EnSeal, separated the tube from the underlying ovary.  The tube was removed through the subumbilical port and sent for pathological review.  We then went to the left.  The left tube was elevated using the EnSeal.  The peritoneal attachment of the tubes were cauterized and incised.  The tube was removed through the subumbilical incision.  Both were sent to Pathology.  At this point in time, the uterus was elevated, first went to the right area.  The right utero-ovarian pedicle was cauterized and incised.  The right round ligament was cauterized and incised.  We then went to the left side.  Again using the EnSeal, the left utero-ovarian pedicle was cauterized and incised.  The left round ligament was cauterized and incised.  We had good hemostasis and separation of the attachments to the uterus.  We then decided to go vaginally.  The abdomen was deflated  with carbon dioxide.  The laparoscope was removed.  The patient's legs were repositioned.  She had slid up on the table and had to be pulled down lower.  We then had to readjust her legs and arms.  At this point in time, a weighted speculum was placed in the vaginal vault.  The cervix was grasped with a Christella HartiganJacobs tenaculum.  Cul-de-sac was entered sharply.  Both uterosacral ligaments were clamped, cut, and suture ligated with 0 Vicryl.  The reflection of the vaginal mucosa around the cervix was then incised using the bipolar. The bladder was dissected superiorly.  Paracervical tissue was clamped, cut, and suture ligated with 0 Vicryl.  The vesicouterine space was identified and entered.  Retractor was put  in place to retract the bladder superiorly out of the operative field.  Using the clamp, cut, and tie technique with suture ligature of 0 Vicryl, the parametrium was serially separated from the sites of the uterus.  Uterus was then flipped, remaining pedicles were clamped and cut.  Uterus and cervix were passed off the operative field and sent to Pathology.  Held pedicles were secured with free ties of 0 Vicryl.  Posterior vaginal cuff was run with a running locking suture of 0 Vicryl.  An area of bleeding on the left side of the uterine cuff, brought on control with figure-of-eights of 0 Vicryl.  Next, the vaginal mucosa was reapproximated with figure-of-eights of 0 Vicryl.  Once that was completely reapproximated, Foley was placed to straight drain.  Clear urine output was noted.  The patient's legs were repositioned.  The abdomen was reinflated with carbon dioxide.  Laparoscope was then reintroduced using the Nezhat suction device.  We thoroughly irrigated the pelvis and removed the fluid.  We had a little bit of oozing from the right area of the ovary that was brought on control with the bipolar.  The vaginal cuff looked nicely, hemostatically intact and there was no bleeding from the left ovary.  We thoroughly irrigated the pelvis and removed all irrigation, deflated the abdomen and later reinflated.  There was no evidence of any active bleeding.  At this point in time, the abdomen was deflated with carbon dioxide.  All trocars were removed.  Subumbilical fascia was closed with figure-of-eight of 0 Vicryl.  Skin was closed with interrupted subcuticulars of 4-0 Vicryl.  The suprapubic incision was closed with interrupted subcuticular 4-0 Vicryl.  At this point in time, the patient was taken out of the dorsal lithotomy position.  Sponge, instrument, and needle count were reported as correct by circulating nurse x2.  Foley catheter remained clear at the time of closure.  The patient  tolerated the procedure well.  Once extubated, transferred to the recovery room in good condition.     Juluis MireJohn S. Joevanni Roddey, M.D.     JSM/MEDQ  D:  03/04/2014  T:  03/04/2014  Job:  [id number]

## 2014-10-14 ENCOUNTER — Other Ambulatory Visit: Payer: Self-pay | Admitting: Obstetrics and Gynecology

## 2014-10-15 LAB — CYTOLOGY - PAP

## 2015-02-25 ENCOUNTER — Ambulatory Visit: Payer: Self-pay | Admitting: Sports Medicine

## 2015-10-21 ENCOUNTER — Telehealth: Payer: Self-pay | Admitting: *Deleted

## 2015-10-21 ENCOUNTER — Ambulatory Visit: Payer: BLUE CROSS/BLUE SHIELD

## 2015-10-21 ENCOUNTER — Ambulatory Visit (INDEPENDENT_AMBULATORY_CARE_PROVIDER_SITE_OTHER): Payer: BLUE CROSS/BLUE SHIELD | Admitting: Sports Medicine

## 2015-10-21 ENCOUNTER — Encounter: Payer: Self-pay | Admitting: Sports Medicine

## 2015-10-21 ENCOUNTER — Ambulatory Visit (INDEPENDENT_AMBULATORY_CARE_PROVIDER_SITE_OTHER): Payer: BLUE CROSS/BLUE SHIELD

## 2015-10-21 DIAGNOSIS — M79675 Pain in left toe(s): Secondary | ICD-10-CM

## 2015-10-21 DIAGNOSIS — M2040 Other hammer toe(s) (acquired), unspecified foot: Secondary | ICD-10-CM

## 2015-10-21 DIAGNOSIS — L84 Corns and callosities: Secondary | ICD-10-CM

## 2015-10-21 DIAGNOSIS — E119 Type 2 diabetes mellitus without complications: Secondary | ICD-10-CM | POA: Diagnosis not present

## 2015-10-21 DIAGNOSIS — M79674 Pain in right toe(s): Secondary | ICD-10-CM | POA: Diagnosis not present

## 2015-10-21 NOTE — Patient Instructions (Signed)
Pre-Operative Instructions  Congratulations, you have decided to take an important step to improving your quality of life.  You can be assured that the doctors of [facility] will be with you every step of the way.  1. Plan to be at the surgery center/hospital at least 1 (one) hour prior to your scheduled time unless otherwise directed by the surgical center/hospital staff.  You must have a responsible adult accompany you, remain during the surgery and drive you home.  Make sure you have directions to the surgical center/hospital and know how to get there on time. 2. For hospital based surgery you will need to obtain a history and physical form from your family physician within 1 month prior to the date of surgery- we will give you a form for you primary physician.  3. We make every effort to accommodate the date you request for surgery.  There are however, times where surgery dates or times have to be moved.  We will contact you as soon as possible if a change in schedule is required.   4. No Aspirin/Ibuprofen for one week before surgery.  If you are on aspirin, any non-steroidal anti-inflammatory medications (Mobic, Aleve, Ibuprofen) you should stop taking it 7 days prior to your surgery.  You make take Tylenol  For pain prior to surgery.  5. Medications- If you are taking daily heart and blood pressure medications, seizure, reflux, allergy, asthma, anxiety, pain or diabetes medications, make sure the surgery center/hospital is aware before the day of surgery so they may notify you which medications to take or avoid the day of surgery. 6. No food or drink after midnight the night before surgery unless directed otherwise by surgical center/hospital staff. 7. No alcoholic beverages 24 hours prior to surgery.  No smoking 24 hours prior to or 24 hours after surgery. 8. Wear loose pants or shorts- loose enough to fit over bandages, boots, and casts. 9. No slip on shoes, sneakers are best. 10. Bring  your boot with you to the surgery center/hospital.  Also bring crutches or a walker if your physician has prescribed it for you.  If you do not have this equipment, it will be provided for you after surgery. 11. If you have not been contracted by the surgery center/hospital by the day before your surgery, call to confirm the date and time of your surgery. 12. Leave-time from work may vary depending on the type of surgery you have.  Appropriate arrangements should be made prior to surgery with your employer. 13. Prescriptions will be provided immediately following surgery by your doctor.  Have these filled as soon as possible after surgery and take the medication as directed. 14. Remove nail polish on the operative foot. 15. Wash the night before surgery.  The night before surgery wash the foot and leg well with the antibacterial soap provided and water paying special attention to beneath the toenails and in between the toes.  Rinse thoroughly with water and dry well with a towel.  Perform this wash unless told not to do so by your physician.  Enclosed: 1 Ice pack (please put in freezer the night before surgery)   1 Hibiclens skin cleaner   Pre-op Instructions  If you have any questions regarding the instructions, do not hesitate to call our office.  [place]: [address] [place], [place] [address] [phone]  [place]: [address] [place], [place] [address] [phone]  [place]: [address]  [place], [place] [address] [phone]   Dr.   Norman Regal DPM, Dr. Matthew Wagoner DPM, Dr. M. Todd Hyatt DPM, Dr. [place] Festa DPM 

## 2015-10-21 NOTE — Progress Notes (Signed)
Patient ID: [name] [name], female   DOB: [birth year], 45 y.o.   MRN: [id number] Subjective: [name] [name] is a 45 y.o. Diabetic female patient who presents to office for evaluation of Right> Left foot pain. Patient complains of progressive pain especially over the last year in the Right>Left foot at the 5th toes. Patient has tried wider steel toe boots, shoes, pads, trimmings by a podiatrist in [place] with no relief in symptoms. Patient desires more aggressive treatment options. Patient denies any other pedal complaints.   FBS 101  Admits to history of gestational diabetes in 2008 and was diagnosed with diabetes in 2010. Admits that she is going to see her PCP on Friday for routine check up.   Patient Active Problem List   Diagnosis Date Noted  . S/P laparoscopic assisted vaginal hysterectomy (LAVH) 03/04/2014    Class: Status post  . BP (high blood pressure) 09/09/2011  . Type 2 diabetes mellitus (HCC) 09/09/2011    Current Outpatient Prescriptions on File Prior to Visit  Medication Sig Dispense Refill  . ferrous sulfate 325 (65 FE) MG tablet Take 325 mg by mouth 2 (two) times daily with a meal.     . lisinopril (PRINIVIL,ZESTRIL) 5 MG tablet Take 5 mg by mouth every morning.     . metFORMIN (GLUCOPHAGE) 1000 MG tablet Take 1,000 mg by mouth 2 (two) times daily with a meal.    . oxyCODONE-acetaminophen (PERCOCET) 7.5-325 MG per tablet Take 1 tablet by mouth every 4 (four) hours as needed for pain. 30 tablet 0   No current facility-administered medications on file prior to visit.    No Known Allergies  Objective:  General: Alert and oriented x3 in no acute distress  Dermatology: Small hyperkeratotic lesion overlying 5th PIPJ dorsally bilateral. No open lesions bilateral lower extremities, no webspace macerations, no ecchymosis bilateral, all nails x 10 are well manicured.  Vascular: Dorsalis Pedis and Posterior Tibial pedal pulses 2/4, Capillary Fill Time 3 seconds,(+) pedal hair  growth bilateral, no edema bilateral lower extremities, Temperature gradient within normal limits.  Neurology: Michaell CowingGross sensation intact via light touch bilateral, Protective sensation intact  with Semmes Weinstein Monofilament to all pedal sites, Position sense intact, vibratory intact bilateral, Deep tendon reflexes within normal limits bilateral, No babinski sign present bilateral. (- )Tinels sign bilateral.  Musculoskeletal: Semi-flexible hammertoes 4-5 with Mild tenderness with palpation at 5th PIPJ Right>Left. Ankle, Subtalar, Midtarsal, and MTPJ joint range of motion is within normal limits, there is no 1st ray hypermobility noted bilateral, No bunion deformity noted bilateral. No pain with calf compression bilateral.  Strength within normal limits in all groups bilateral.   Xrays  Right and Left Foot    Impression: Normal osseous mineralization. There is significant varus rotated 5th hammertoes greater than fourth, with mild enlargement of the head of the proximal phalanx of lateral condyle right>left. There is no fracture, no dislocation, soft tissue margins within normal limits. No foreign body.       Assessment and Plan: Problem List Items Addressed This Visit    None    Visit Diagnoses    Hammertoe, unspecified laterality    -  Primary    Relevant Orders    DG Foot 2 Views Left    DG Foot 2 Views Right    Corns and callosities        Relevant Orders    DG Foot 2 Views Left    DG Foot 2 Views Right    Toe pain, bilateral  Relevant Orders    DG Foot 2 Views Left    DG Foot 2 Views Right    Diabetes mellitus without complication (HCC)        Relevant Medications    JANUVIA 50 MG tablet    Insulin Detemir (LEVEMIR FLEXTOUCH) 100 UNIT/ML Pen    insulin aspart (NOVOLOG FLEXPEN) 100 UNIT/ML FlexPen       -Complete examination performed -Xrays reviewed -Discussed treatement options For varus rotated painful fifth hammertoes greater than fourth bilateral -Patient opt for  surgical management. Consent obtained for fifth hammertoe repair, bilateral. Pre and Post op course explained. Risks, benefits, alternatives explained. No guarantees given or implied. Surgical booking slip submitted and provided patient with Surgical packet and info for [facility]. -Dispensed a pair of surgical shoes to use post op -Advised patient to take at least 1 month off of work to allow for sufficient postoperative healing -Patient to return to office after surgery or sooner if condition worsens. Patient is aware that doing her postoperative period my office hours in [place] will be changing and that she will have to follow up with me in [place]. Patient reports that she is fine with this and will be able to do so. Advised patient that since she has her primary care appointment on Friday to discuss surgery with her primary care doctor for medical clearance and to have her doctor to look at her diabetic medications to give recommendations on appropriate dosing perioperatively.  Asencion Islam, DPM

## 2015-10-21 NOTE — Telephone Encounter (Signed)
"  I just saw Dr. Marylene LandStover in the [place] office.  She told me to call you to set up surgery."  Dr. Marylene LandStover does surgery on Mondays.  Do you have a date in mind?  "I can do it Monday of next week or the following week."  What are you having surgery for?  "I have corns on my toes."  She can do it on Monday, July 31.  "What time will it be?"  Someone from the surgical center will call you with the time the Friday before.  You can go ahead and register with the surgical center.  Instructions are in the brochure that was given to you.  If you don't have access to a computer, someone from surgery center will call and you can give them information.

## 2015-10-22 ENCOUNTER — Encounter: Payer: Self-pay | Admitting: *Deleted

## 2015-10-22 ENCOUNTER — Telehealth: Payer: Self-pay | Admitting: *Deleted

## 2015-10-22 NOTE — Telephone Encounter (Signed)
"  I'm scheduled to have surgery on July 31.  I need to get a note for my job stating that I'm going to have surgery on July 31.  Is there anyway I can come by and get a note?"  I'll get one ready for you.  "Is it okay for my boyfriend to come by and pick that up?"  What's his name?  "His name is Al Daphine DeutscherMartin."  I'll have it ready for him.

## 2015-10-24 ENCOUNTER — Telehealth: Payer: Self-pay | Admitting: *Deleted

## 2015-10-24 NOTE — Telephone Encounter (Signed)
"  I have a scheduled surgery for July 31.  I'm trying to see if there is a form that needs to be sent to my doctor's office for them to fill out to say it's okay for me to have surgery since I have Diabetes.  If there's a form that needs to be filled out, you can fax it to [employer] [facility] in [place] at [phone].

## 2015-10-28 ENCOUNTER — Encounter: Payer: Self-pay | Admitting: *Deleted

## 2015-10-28 NOTE — Telephone Encounter (Signed)
I'm calling to ask the name of your Diabetic Doctor.  I'm trying to send a letter of medical clearance to them as you requested.  "It's is Dr. Uvaldo Rising at [facility] in [place], [place]."  Okay thank you.

## 2015-10-28 NOTE — Telephone Encounter (Signed)
Medical clearance letter for surgery was sent to Dr. Charlott Rakes.

## 2015-10-29 ENCOUNTER — Telehealth: Payer: Self-pay | Admitting: Sports Medicine

## 2015-10-29 NOTE — Telephone Encounter (Signed)
Received reply from Dr. Dorene Sorrow patient's Diabetic doctor stating that A1c is 13.2. At this time surgery will have to be post-poned until A1c is significantly improved to 7 before we can proceed with elective surgery. My surgical scheduler will inform patient of this cancellation and once patient has better control of her diabetes then we proceed with hammertoe repair.  -Dr. Marylene Land

## 2015-10-30 NOTE — Telephone Encounter (Signed)
Dr. Charlott Rakes responded to medical clearance request. He stated patient's Diabetes is not under control.  Her Hemoglobin A1c is 13.2!"  I faxed the letter to Dr. Marylene Land in [place] office to inform her of Dr. Myrtha Mantis response.  Dr. Marylene Land informed me to cancel patient's surgery.  A1c needs to be below 7 in order to perform surgery.  I attempted to call [name] [name] to inform her that we are going to have to cancel her surgery.  I left her a message to call me right away.  I called and left a message for [name] [name] at [facility] to cancel patient's surgery for 11/03/2015 due to elevated A1c.  [name] [name] returned my call.  I informed her that Dr. Charlott Rakes said her Diabetes is not under control.  He said your glucose level is 13.2.  Dr. Marylene Land said your level needs to be below 7.  She is canceling your surgery for now.  "It was that high when I had my Hysterectomy."  Dr. Marylene Land does not want to put you at risk for not healing.  If it doesn't heal, that could lead to other issues and  Amputation.  "Okay"  You are welcome to give [place] a call back once your glucose is under control to reschedule.

## 2015-11-11 ENCOUNTER — Encounter: Payer: BLUE CROSS/BLUE SHIELD | Admitting: Sports Medicine

## 2015-11-18 ENCOUNTER — Encounter: Payer: BLUE CROSS/BLUE SHIELD | Admitting: Sports Medicine

## 2016-01-13 ENCOUNTER — Encounter: Payer: Self-pay | Admitting: Sports Medicine

## 2016-01-13 ENCOUNTER — Ambulatory Visit (INDEPENDENT_AMBULATORY_CARE_PROVIDER_SITE_OTHER): Payer: BLUE CROSS/BLUE SHIELD | Admitting: Sports Medicine

## 2016-01-13 DIAGNOSIS — M79675 Pain in left toe(s): Secondary | ICD-10-CM | POA: Diagnosis not present

## 2016-01-13 DIAGNOSIS — M79674 Pain in right toe(s): Secondary | ICD-10-CM | POA: Diagnosis not present

## 2016-01-13 DIAGNOSIS — L84 Corns and callosities: Secondary | ICD-10-CM

## 2016-01-13 DIAGNOSIS — E119 Type 2 diabetes mellitus without complications: Secondary | ICD-10-CM | POA: Diagnosis not present

## 2016-01-13 NOTE — Progress Notes (Signed)
Patient ID: [name] [name], female   DOB: 02-Jun-1970, 45 y.o.   MRN: [id number] Subjective: [name] [name] is a 45 y.o. Diabetic female patient who returns to office for evaluation of Right> Left foot pain at 5th toes. Patient states that she is to see her Diabetic doctor on 10/23 for A1c follow up. States that after 5 hours of work her toes her especially the right hurt. Patient denies any other pedal complaints.   FBS 80-89  Patient Active Problem List   Diagnosis Date Noted  . S/P laparoscopic assisted vaginal hysterectomy (LAVH) 03/04/2014    Class: Status post  . BP (high blood pressure) 09/09/2011  . Type 2 diabetes mellitus (HCC) 09/09/2011    Current Outpatient Prescriptions on File Prior to Visit  Medication Sig Dispense Refill  . ciprofloxacin (CIPRO) 500 MG tablet Take 500 mg by mouth 2 (two) times daily.  0  . ferrous sulfate 325 (65 FE) MG tablet Take 325 mg by mouth 2 (two) times daily with a meal.     . fluconazole (DIFLUCAN) 150 MG tablet TK 1 T PO QD FOR 2 DOSES  0  . insulin aspart (NOVOLOG FLEXPEN) 100 UNIT/ML FlexPen Inject into the skin.    . Insulin Detemir (LEVEMIR FLEXTOUCH) 100 UNIT/ML Pen     . Insulin Syringe-Needle U-100 (B-D INS SYR ULTRAFINE 1CC/30G) 30G X 1/2" 1 ML MISC by Does not apply route.    Marland Kitchen JANUVIA 50 MG tablet TK 1 T PO QD  0  . lisinopril (PRINIVIL,ZESTRIL) 5 MG tablet Take 5 mg by mouth every morning.     . metFORMIN (GLUCOPHAGE) 1000 MG tablet Take 1,000 mg by mouth 2 (two) times daily with a meal.    . oxyCODONE-acetaminophen (PERCOCET) 7.5-325 MG per tablet Take 1 tablet by mouth every 4 (four) hours as needed for pain. 30 tablet 0   No current facility-administered medications on file prior to visit.     No Known Allergies  Objective:  General: Alert and oriented x3 in no acute distress  Dermatology: Small hyperkeratotic lesion overlying 5th PIPJ dorsally bilateral. No open lesions bilateral lower extremities, no webspace  macerations, no ecchymosis bilateral, all nails x 10 are well manicured.  Vascular: Dorsalis Pedis and Posterior Tibial pedal pulses 2/4, Capillary Fill Time 3 seconds,(+) pedal hair growth bilateral, no edema bilateral lower extremities, Temperature gradient within normal limits.  Neurology: Michaell Cowing sensation intact via light touch bilateral, Protective sensation intact  with Semmes Weinstein Monofilament to all pedal sites, Position sense intact, vibratory intact bilateral, Deep tendon reflexes within normal limits bilateral, No babinski sign present bilateral. (- )Tinels sign bilateral.  Musculoskeletal: Semi-flexible hammertoes 4-5 with Mild tenderness with palpation at 5th PIPJ Right>Left. Ankle, Subtalar, Midtarsal, and MTPJ joint range of motion is within normal limits, there is no 1st ray hypermobility noted bilateral, No bunion deformity noted bilateral. No pain with calf compression bilateral.  Strength within normal limits in all groups bilateral.         Assessment and Plan: Problem List Items Addressed This Visit    None    Visit Diagnoses    Corns and callosities    -  Primary   Toe pain, bilateral       Diabetes mellitus without complication (HCC)       Relevant Medications   VICTOZA 18 MG/3ML SOPN      -Complete examination performed -Discussed treatement options For varus rotated painful fifth hammertoes -Patient to follow up with Diabetic doctor  and will discuss afterwards and re-sign surgery consent for hammertoe repair -Meanwhile dispensed toe cushions -Work note: Reduce hours -Return after seen by diabetic doctor for surgery consult   Asencion Islamitorya Yolandra Habig, DPM

## 2016-01-27 ENCOUNTER — Encounter: Payer: Self-pay | Admitting: *Deleted

## 2016-01-27 ENCOUNTER — Telehealth: Payer: Self-pay | Admitting: *Deleted

## 2016-01-27 NOTE — Telephone Encounter (Signed)
4-6 weeks out of work A1c close to 7 or trending down will be great. She has a past history of her A1c as high as 13  Before -Dr. Marylene LandStover

## 2016-01-27 NOTE — Telephone Encounter (Signed)
Thank you :)

## 2016-01-27 NOTE — Telephone Encounter (Addendum)
Pt presents to office and request note detailing how long she will be out of work and what her A1C would need to be to have the surgery.Kim - Dr. Glenice BowBalakrishnan states pt's doctor would like to know the type of surgery and type of anesthesia to be used.  I informed Selena BattenKim, pt would be having B/L 5th hammer toe repairs under local and IV sedation. Kim requested last chart. I faxed to [phone].

## 2016-01-28 ENCOUNTER — Telehealth: Payer: Self-pay | Admitting: *Deleted

## 2016-01-28 NOTE — Telephone Encounter (Signed)
Ok. Thank you.

## 2016-01-28 NOTE — Telephone Encounter (Signed)
"  I want to schedule my surgery with Dr. Marylene LandStover."  Have you signed the consent forms?  "I think I did that back in July when I was scheduled for surgery.  I was not able to do it because my A1c was high and I could not do it."  Okay, when would you like to schedule?  "I would like to do it as soon as possible."  She can do it on November 6.  "That will be fine."  Do you still have your blue bag with surgical information in it?  "Yes, I do."  Go ahead and register with the surgical center, instructions are in the brochure.  The surgical center will call you with the arrival time the Friday before surgery date.  You will need to get your doctor that treats you for Diabetes to send a letter of medical clearance stating you are cleared to have surgery.  "I actually called yesterday and put in the request.  I will call them again today to follow up on it."

## 2016-01-30 ENCOUNTER — Encounter: Payer: Self-pay | Admitting: Sports Medicine

## 2016-02-02 ENCOUNTER — Encounter: Payer: Self-pay | Admitting: Sports Medicine

## 2016-02-03 ENCOUNTER — Encounter: Payer: Self-pay | Admitting: Sports Medicine

## 2016-02-04 ENCOUNTER — Telehealth: Payer: Self-pay | Admitting: *Deleted

## 2016-02-04 NOTE — Telephone Encounter (Signed)
"  I was calling to see if my surgery is still scheduled for Monday.   If so I need to know where I need to be and what time. I haven't heard anything back.  Give me a call back."  I responded to patient via e-mail.

## 2016-02-06 ENCOUNTER — Encounter: Payer: Self-pay | Admitting: Sports Medicine

## 2016-02-09 ENCOUNTER — Encounter: Payer: Self-pay | Admitting: Sports Medicine

## 2016-02-09 DIAGNOSIS — M2041 Other hammer toe(s) (acquired), right foot: Secondary | ICD-10-CM | POA: Diagnosis not present

## 2016-02-09 DIAGNOSIS — M2042 Other hammer toe(s) (acquired), left foot: Secondary | ICD-10-CM | POA: Diagnosis not present

## 2016-02-10 ENCOUNTER — Telehealth: Payer: Self-pay | Admitting: Sports Medicine

## 2016-02-10 NOTE — Telephone Encounter (Signed)
Called patient to check on her after surgery. Patient reports that she is a little sore but otherwise she is doing well with no symptoms. I advised patient to continue with rest, ice, elevation, and pain medications as needed. Patient to follow up in office next week. -Dr. Marylene LandStover

## 2016-02-17 ENCOUNTER — Ambulatory Visit (INDEPENDENT_AMBULATORY_CARE_PROVIDER_SITE_OTHER): Payer: BLUE CROSS/BLUE SHIELD | Admitting: Sports Medicine

## 2016-02-17 ENCOUNTER — Encounter: Payer: Self-pay | Admitting: Sports Medicine

## 2016-02-17 ENCOUNTER — Ambulatory Visit (INDEPENDENT_AMBULATORY_CARE_PROVIDER_SITE_OTHER): Payer: BLUE CROSS/BLUE SHIELD

## 2016-02-17 VITALS — BP 133/86 | HR 86 | Temp 98.9°F | Resp 16

## 2016-02-17 DIAGNOSIS — M2040 Other hammer toe(s) (acquired), unspecified foot: Secondary | ICD-10-CM

## 2016-02-17 DIAGNOSIS — E119 Type 2 diabetes mellitus without complications: Secondary | ICD-10-CM

## 2016-02-17 DIAGNOSIS — M79675 Pain in left toe(s): Secondary | ICD-10-CM

## 2016-02-17 DIAGNOSIS — Z9889 Other specified postprocedural states: Secondary | ICD-10-CM

## 2016-02-17 DIAGNOSIS — M79674 Pain in right toe(s): Secondary | ICD-10-CM

## 2016-02-17 NOTE — Progress Notes (Signed)
Subjective: [name] [name] is a 45 y.o. Diabetic female patient seen today in office for POV #1 (DOS 02-09-16), S/P Bilateral 5th hammertoe repair. Patient denies pain at surgical site, denies calf pain, denies headache, chest pain, shortness of breath, nausea, vomiting, fever, or chills. Patient states that she is doing well and is only taking Ibuprofen. No other issues noted.   FBS good  Patient Active Problem List   Diagnosis Date Noted  . S/P laparoscopic assisted vaginal hysterectomy (LAVH) 03/04/2014    Class: Status post  . BP (high blood pressure) 09/09/2011  . Type 2 diabetes mellitus (HCC) 09/09/2011    Current Outpatient Prescriptions on File Prior to Visit  Medication Sig Dispense Refill  . ciprofloxacin (CIPRO) 500 MG tablet Take 500 mg by mouth 2 (two) times daily.  0  . ferrous sulfate 325 (65 FE) MG tablet Take 325 mg by mouth 2 (two) times daily with a meal.     . fluconazole (DIFLUCAN) 150 MG tablet TK 1 T PO QD FOR 2 DOSES  0  . insulin aspart (NOVOLOG FLEXPEN) 100 UNIT/ML FlexPen Inject into the skin.    . Insulin Detemir (LEVEMIR FLEXTOUCH) 100 UNIT/ML Pen     . Insulin Pen Needle (FIFTY50 PEN NEEDLES) 31G X 5 MM MISC 1 Each by external route four times daily PT USES WITH NOVOLOG TID AND LEVEMIR QHS DX: E11.65    . Insulin Syringe-Needle U-100 (B-D INS SYR ULTRAFINE 1CC/30G) 30G X 1/2" 1 ML MISC by Does not apply route.    Marland Kitchen. JANUVIA 50 MG tablet TK 1 T PO QD  0  . lisinopril (PRINIVIL,ZESTRIL) 5 MG tablet Take 5 mg by mouth every morning.     . metFORMIN (GLUCOPHAGE) 1000 MG tablet Take 1,000 mg by mouth 2 (two) times daily with a meal.    . oxyCODONE-acetaminophen (PERCOCET) 7.5-325 MG per tablet Take 1 tablet by mouth every 4 (four) hours as needed for pain. 30 tablet 0  . VICTOZA 18 MG/3ML SOPN   0   No current facility-administered medications on file prior to visit.     No Known Allergies  Objective: There were no vitals filed for this visit.  General:  No acute distress, AAOx3  Right and Left foot: Sutures intact with no gapping or dehiscence at surgical sites 5th toes, mild swelling to 5th toes, no erythema, no warmth, no drainage, no signs of infection noted, Capillary fill time <3 seconds in all digits, gross sensation present via light touch to right and left foot. No pain or crepitation with range of motion right and left foot.  No pain with calf compression.   Post Op Xray, Right andLeft foot: 5th toe arthroplasties. Soft tissue swelling within normal limits for post op status.   Assessment and Plan:  Problem List Items Addressed This Visit    None    Visit Diagnoses    Post-operative state    -  Primary   Relevant Orders   DG Foot Complete Right   DG Foot Complete Left   Toe pain, bilateral       Diabetes mellitus without complication (HCC)          -Patient seen and evaluated -Applied dry sterile dressing to surgical site right and left foot secured with ACE wrap and stockinet  -Advised patient to make sure to keep dressings clean, dry, and intact to left and right surgical sites, removing the ACE as needed  -Advised patient to continue with post-op shoes  bilateral -Advised patient to limit activity to necessity  -Advised patient to ice and elevate as necessary  -Will plan for suture removal at next office visit and slow transition to normal shoes as swelling allows. In the meantime, patient to call office if any issues or problems arise.   [name] [name], DPM

## 2016-02-24 ENCOUNTER — Encounter: Payer: BLUE CROSS/BLUE SHIELD | Admitting: Sports Medicine

## 2016-02-24 ENCOUNTER — Ambulatory Visit (INDEPENDENT_AMBULATORY_CARE_PROVIDER_SITE_OTHER): Payer: BLUE CROSS/BLUE SHIELD | Admitting: Sports Medicine

## 2016-02-24 DIAGNOSIS — M79674 Pain in right toe(s): Secondary | ICD-10-CM

## 2016-02-24 DIAGNOSIS — E119 Type 2 diabetes mellitus without complications: Secondary | ICD-10-CM

## 2016-02-24 DIAGNOSIS — M79675 Pain in left toe(s): Secondary | ICD-10-CM

## 2016-02-24 DIAGNOSIS — Z9889 Other specified postprocedural states: Secondary | ICD-10-CM

## 2016-02-24 NOTE — Progress Notes (Signed)
Subjective: [name] [name] is a 45 y.o. Diabetic female patient seen today in office for POV #2 (DOS 02-09-16), S/P Bilateral 5th hammertoe repair. Patient denies pain at surgical site, denies calf pain, denies headache, chest pain, shortness of breath, nausea, vomiting, fever, or chills. Patient states that she is doing well and is only taking Ibuprofen as needed. No other issues noted.   FBS good  Patient Active Problem List   Diagnosis Date Noted  . S/P laparoscopic assisted vaginal hysterectomy (LAVH) 03/04/2014    Class: Status post  . BP (high blood pressure) 09/09/2011  . Type 2 diabetes mellitus (HCC) 09/09/2011    Current Outpatient Prescriptions on File Prior to Visit  Medication Sig Dispense Refill  . ciprofloxacin (CIPRO) 500 MG tablet Take 500 mg by mouth 2 (two) times daily.  0  . ferrous sulfate 325 (65 FE) MG tablet Take 325 mg by mouth 2 (two) times daily with a meal.     . fluconazole (DIFLUCAN) 150 MG tablet TK 1 T PO QD FOR 2 DOSES  0  . insulin aspart (NOVOLOG FLEXPEN) 100 UNIT/ML FlexPen Inject into the skin.    . Insulin Detemir (LEVEMIR FLEXTOUCH) 100 UNIT/ML Pen     . Insulin Pen Needle (FIFTY50 PEN NEEDLES) 31G X 5 MM MISC 1 Each by external route four times daily PT USES WITH NOVOLOG TID AND LEVEMIR QHS DX: E11.65    . Insulin Syringe-Needle U-100 (B-D INS SYR ULTRAFINE 1CC/30G) 30G X 1/2" 1 ML MISC by Does not apply route.    Marland Kitchen. JANUVIA 50 MG tablet TK 1 T PO QD  0  . lisinopril (PRINIVIL,ZESTRIL) 5 MG tablet Take 5 mg by mouth every morning.     . metFORMIN (GLUCOPHAGE) 1000 MG tablet Take 1,000 mg by mouth 2 (two) times daily with a meal.    . oxyCODONE-acetaminophen (PERCOCET) 7.5-325 MG per tablet Take 1 tablet by mouth every 4 (four) hours as needed for pain. 30 tablet 0  . VICTOZA 18 MG/3ML SOPN   0   No current facility-administered medications on file prior to visit.     No Known Allergies  Objective: There were no vitals filed for this  visit.  General: No acute distress, AAOx3  Right and Left foot: Sutures intact with no gapping or dehiscence at surgical sites 5th toes, mild swelling to 5th toes, no erythema, no warmth, no drainage, no signs of infection noted, Capillary fill time <3 seconds in all digits, gross sensation present via light touch to right and left foot. No pain or crepitation with range of motion right and left foot.  No pain with calf compression.   Assessment and Plan:  Problem List Items Addressed This Visit    None    Visit Diagnoses    Post-operative state    -  Primary   Toe pain, bilateral       Diabetes mellitus without complication (HCC)          -Patient seen and evaluated -Sutures removed. Steri-strips applied. Patient may shower as normal allowing strips to fall off -Advised patient to continue with post-op shoes bilateral with slow transition to normal shoes as swelling allows -Advised patient to limit activity to tolerance -Advised patient to ice and elevate as necessary  -Encouraged range of motion and scar creams once strips fall off -Return in 2 week continued post-op care. In the meantime, patient to call office if any issues or problems arise.   Asencion Islamitorya Rylann Munford, DPM

## 2016-03-16 ENCOUNTER — Ambulatory Visit (INDEPENDENT_AMBULATORY_CARE_PROVIDER_SITE_OTHER): Payer: BLUE CROSS/BLUE SHIELD | Admitting: Sports Medicine

## 2016-03-16 ENCOUNTER — Encounter: Payer: Self-pay | Admitting: Sports Medicine

## 2016-03-16 DIAGNOSIS — M79675 Pain in left toe(s): Secondary | ICD-10-CM

## 2016-03-16 DIAGNOSIS — E119 Type 2 diabetes mellitus without complications: Secondary | ICD-10-CM

## 2016-03-16 DIAGNOSIS — Z9889 Other specified postprocedural states: Secondary | ICD-10-CM

## 2016-03-16 DIAGNOSIS — M79674 Pain in right toe(s): Secondary | ICD-10-CM

## 2016-03-16 NOTE — Progress Notes (Signed)
Subjective: [name] [name] is a 45 y.o. Diabetic female patient seen today in office for POV #3 (DOS 02-09-16), S/P Bilateral 5th hammertoe repair. Patient denies pain at surgical site, denies calf pain, denies headache, chest pain, shortness of breath, nausea, vomiting, fever, or chills. Patient states that she is doing well in normal shoes but still has issues with work boots and needs more time before going back to work. No other issues noted.   FBS good  Patient Active Problem List   Diagnosis Date Noted  . S/P laparoscopic assisted vaginal hysterectomy (LAVH) 03/04/2014    Class: Status post  . BP (high blood pressure) 09/09/2011  . Type 2 diabetes mellitus (HCC) 09/09/2011    Current Outpatient Prescriptions on File Prior to Visit  Medication Sig Dispense Refill  . ciprofloxacin (CIPRO) 500 MG tablet Take 500 mg by mouth 2 (two) times daily.  0  . ferrous sulfate 325 (65 FE) MG tablet Take 325 mg by mouth 2 (two) times daily with a meal.     . fluconazole (DIFLUCAN) 150 MG tablet TK 1 T PO QD FOR 2 DOSES  0  . insulin aspart (NOVOLOG FLEXPEN) 100 UNIT/ML FlexPen Inject into the skin.    . Insulin Detemir (LEVEMIR FLEXTOUCH) 100 UNIT/ML Pen     . Insulin Pen Needle (FIFTY50 PEN NEEDLES) 31G X 5 MM MISC 1 Each by external route four times daily PT USES WITH NOVOLOG TID AND LEVEMIR QHS DX: E11.65    . Insulin Syringe-Needle U-100 (B-D INS SYR ULTRAFINE 1CC/30G) 30G X 1/2" 1 ML MISC by Does not apply route.    Marland Kitchen. JANUVIA 50 MG tablet TK 1 T PO QD  0  . lisinopril (PRINIVIL,ZESTRIL) 5 MG tablet Take 5 mg by mouth every morning.     . metFORMIN (GLUCOPHAGE) 1000 MG tablet Take 1,000 mg by mouth 2 (two) times daily with a meal.    . oxyCODONE-acetaminophen (PERCOCET) 7.5-325 MG per tablet Take 1 tablet by mouth every 4 (four) hours as needed for pain. 30 tablet 0  . VICTOZA 18 MG/3ML SOPN   0   No current facility-administered medications on file prior to visit.     No Known  Allergies  Objective: There were no vitals filed for this visit.  General: No acute distress, AAOx3  Right and Left foot: Incision healing well intact with no gapping or dehiscence at surgical sites 5th toes, mild swelling to 5th toes, no erythema, no warmth, no drainage, no signs of infection noted, Capillary fill time <3 seconds in all digits, gross sensation present via light touch to right and left foot. No pain or crepitation with range of motion right and left foot.  No pain with calf compression.   Assessment and Plan:  Problem List Items Addressed This Visit    None    Visit Diagnoses    Post-operative state    -  Primary   Toe pain, bilateral       Diabetes mellitus without complication (HCC)          -Patient seen and evaluated -Continue with normal shoes to tolerance -Advised patient to limit activity to tolerance -Advised patient to ice and elevate as necessary  -Encouraged range of motion and scar creams  -Return in 4 weeks continued post-op care. Anticipate return to work 04-22-16. In the meantime, patient to call office if any issues or problems arise.   Asencion Islamitorya Helmut Hennon, DPM

## 2016-04-13 ENCOUNTER — Ambulatory Visit (INDEPENDENT_AMBULATORY_CARE_PROVIDER_SITE_OTHER): Payer: BLUE CROSS/BLUE SHIELD

## 2016-04-13 ENCOUNTER — Encounter: Payer: Self-pay | Admitting: Sports Medicine

## 2016-04-13 ENCOUNTER — Ambulatory Visit (INDEPENDENT_AMBULATORY_CARE_PROVIDER_SITE_OTHER): Payer: BLUE CROSS/BLUE SHIELD | Admitting: Podiatry

## 2016-04-13 DIAGNOSIS — M2042 Other hammer toe(s) (acquired), left foot: Secondary | ICD-10-CM

## 2016-04-13 DIAGNOSIS — L84 Corns and callosities: Secondary | ICD-10-CM

## 2016-04-13 DIAGNOSIS — Z9889 Other specified postprocedural states: Secondary | ICD-10-CM

## 2016-04-13 DIAGNOSIS — M2041 Other hammer toe(s) (acquired), right foot: Secondary | ICD-10-CM

## 2016-04-13 DIAGNOSIS — E119 Type 2 diabetes mellitus without complications: Secondary | ICD-10-CM

## 2016-04-13 NOTE — Progress Notes (Signed)
She presents today for her final postop visit status post digital arthroplasty fifth toe bilateral foot. She states that she's doing just great no complications no problems is ready to go back to work next week.  Objective: Vital signs are stable she is alert and oriented 3. Pulses are palpable. Very mild edema to the fifth digits bilateral. Radiographs demonstrate complete arthroplasties PIPJ fifth bilateral. No signs of complications bony regrowth or spurring. No signs of infection.  Assessment: Well healing surgical toes fifth bilateral.  Plan: Follow-up with us on an as-needed basis we will write a note for her to return to work 04/22/2016.

## 2016-04-16 NOTE — Progress Notes (Signed)
DOS 11.06.2017 Right and Left 5th Hammertoe Repair for Painful Corn

## 2016-11-11 ENCOUNTER — Other Ambulatory Visit: Payer: Self-pay | Admitting: Family Medicine

## 2016-11-11 DIAGNOSIS — Z1231 Encounter for screening mammogram for malignant neoplasm of breast: Secondary | ICD-10-CM

## 2016-11-29 ENCOUNTER — Ambulatory Visit: Payer: BLUE CROSS/BLUE SHIELD

## 2016-12-20 ENCOUNTER — Encounter: Payer: Self-pay | Admitting: Gastroenterology

## 2017-02-08 ENCOUNTER — Ambulatory Visit: Payer: BLUE CROSS/BLUE SHIELD | Admitting: Gastroenterology

## 2018-05-02 ENCOUNTER — Other Ambulatory Visit: Payer: Self-pay | Admitting: Obstetrics and Gynecology

## 2018-05-02 DIAGNOSIS — R928 Other abnormal and inconclusive findings on diagnostic imaging of breast: Secondary | ICD-10-CM

## 2018-05-10 ENCOUNTER — Other Ambulatory Visit: Payer: BLUE CROSS/BLUE SHIELD

## 2018-05-11 ENCOUNTER — Ambulatory Visit
Admission: RE | Admit: 2018-05-11 | Discharge: 2018-05-11 | Disposition: A | Discharge status: Home / Self Care | Payer: BLUE CROSS/BLUE SHIELD | Source: Ambulatory Visit | Attending: Obstetrics and Gynecology | Admitting: Obstetrics and Gynecology

## 2018-05-11 ENCOUNTER — Other Ambulatory Visit: Payer: Self-pay | Admitting: Obstetrics and Gynecology

## 2018-05-11 DIAGNOSIS — R928 Other abnormal and inconclusive findings on diagnostic imaging of breast: Secondary | ICD-10-CM

## 2018-05-11 DIAGNOSIS — R2231 Localized swelling, mass and lump, right upper limb: Secondary | ICD-10-CM

## 2018-05-18 ENCOUNTER — Other Ambulatory Visit: Payer: Self-pay | Admitting: Obstetrics and Gynecology

## 2018-05-18 DIAGNOSIS — R2231 Localized swelling, mass and lump, right upper limb: Secondary | ICD-10-CM

## 2018-05-19 ENCOUNTER — Other Ambulatory Visit: Payer: BLUE CROSS/BLUE SHIELD

## 2018-05-24 ENCOUNTER — Other Ambulatory Visit (HOSPITAL_COMMUNITY)
Admission: RE | Admit: 2018-05-24 | Payer: BLUE CROSS/BLUE SHIELD | Source: Ambulatory Visit | Admitting: Obstetrics and Gynecology

## 2018-05-24 ENCOUNTER — Ambulatory Visit
Admission: RE | Admit: 2018-05-24 | Discharge: 2018-05-24 | Disposition: A | Discharge status: Home / Self Care | Payer: BLUE CROSS/BLUE SHIELD | Source: Ambulatory Visit | Attending: Obstetrics and Gynecology | Admitting: Obstetrics and Gynecology

## 2018-05-24 DIAGNOSIS — R2231 Localized swelling, mass and lump, right upper limb: Secondary | ICD-10-CM

## 2019-07-21 IMAGING — MG DIGITAL DIAGNOSTIC UNILATERAL RIGHT MAMMOGRAM WITH TOMO AND CAD
8 series · 8 of 16 positions shown · non-contrast
Comparison: May 01, 2018 and October 14, 2014

CLINICAL DATA: [age] patient recalled from recent screening
mammogram for evaluation a right breast mass possible right axillary
mass. I question the patient about prior and current medical
problems, and we did not did identify any which could explain
axillary lymphadenopathy.

EXAM:
DIGITAL DIAGNOSTIC RIGHT MAMMOGRAM WITH CAD AND TOMO
ULTRASOUND RIGHT BREAST

[R CC]
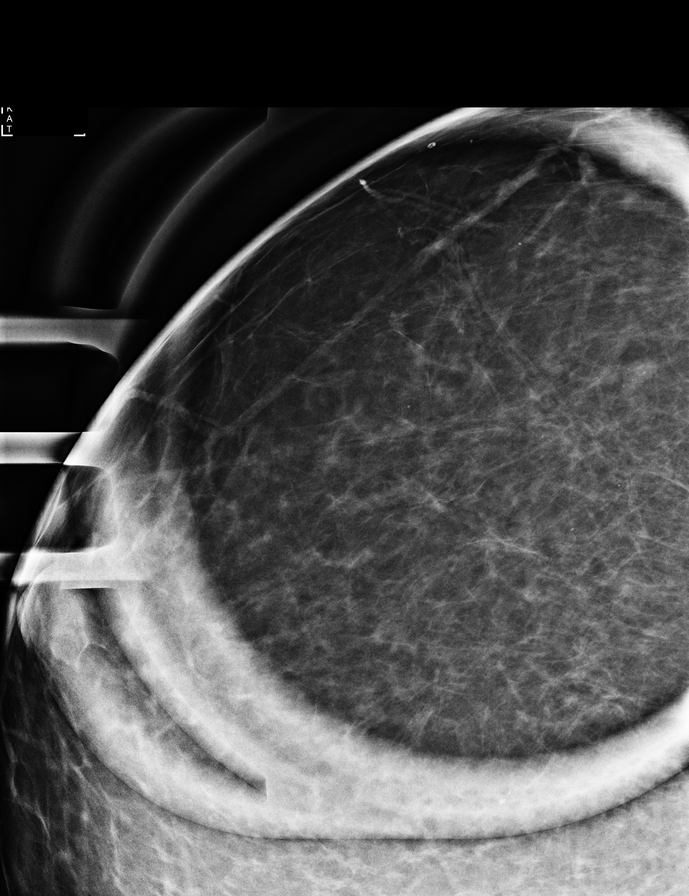

[R ML (1 of 3)]
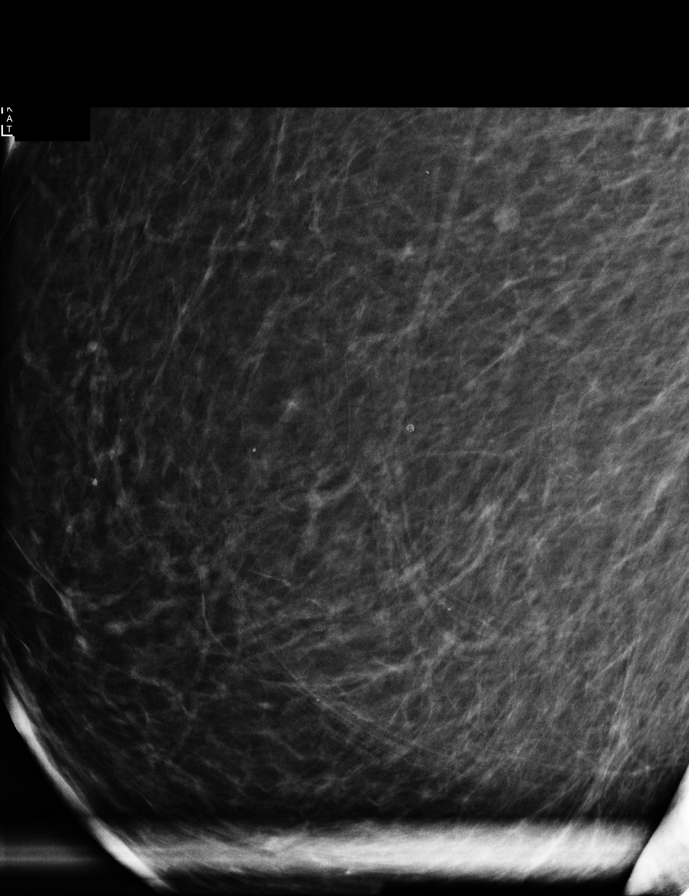

[R ML (2 of 3)]
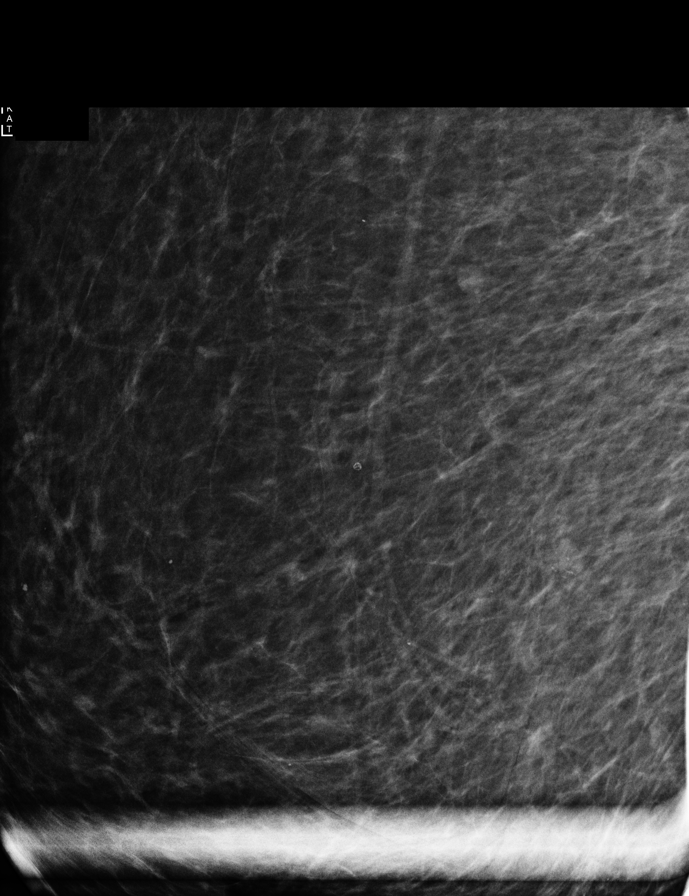

[R ML (3 of 3)]
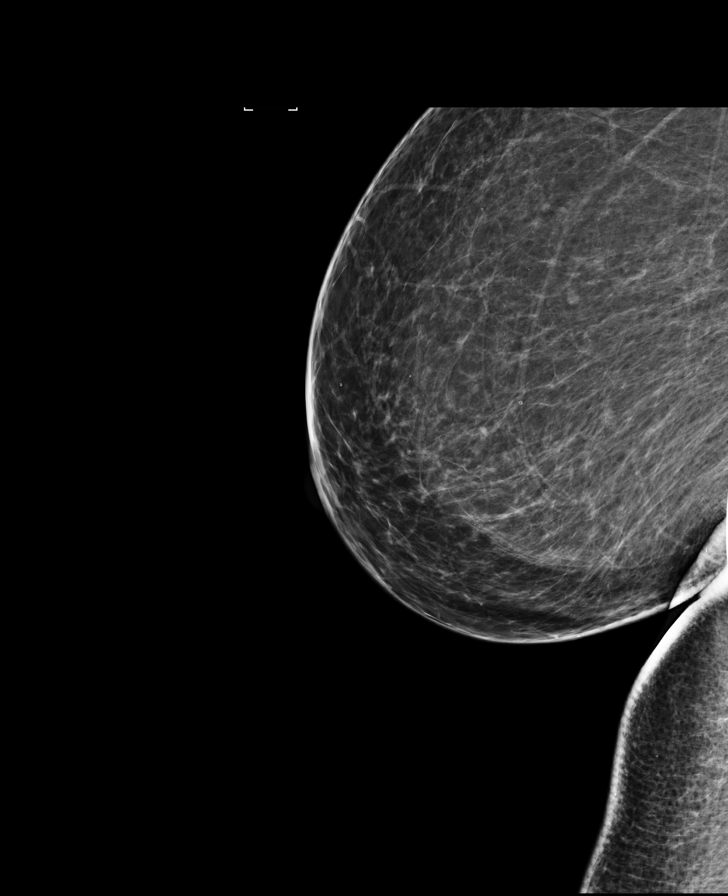

[R MLO synth-2D]
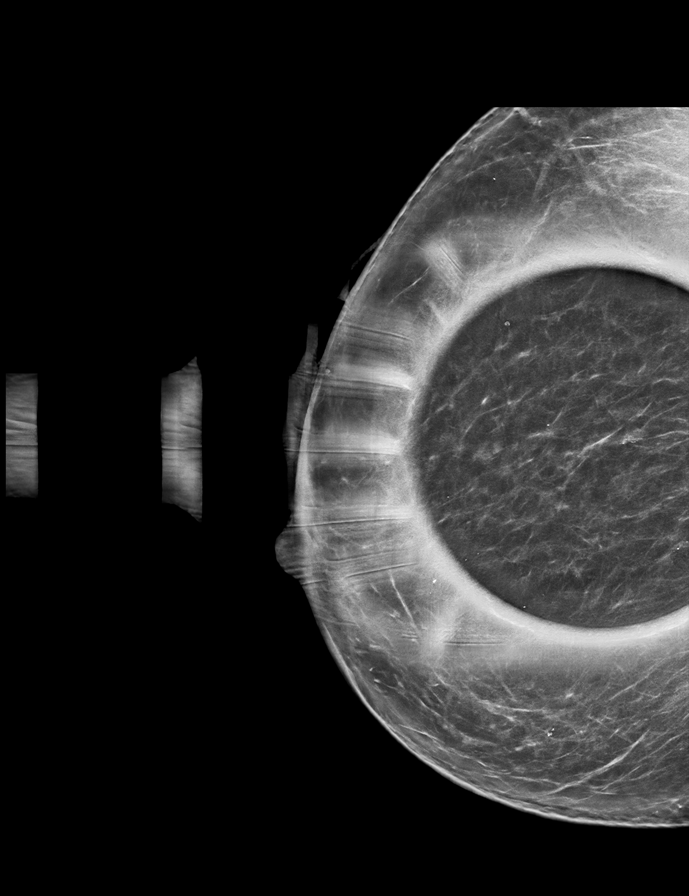

[R CC synth-2D]
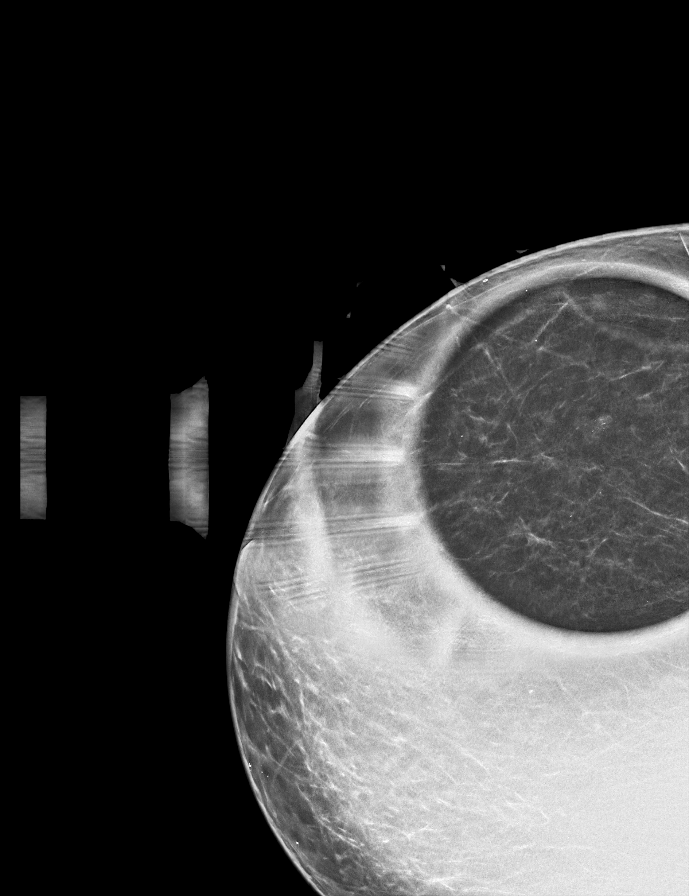

[R MLO tomo · tomo slice 37/74.0]
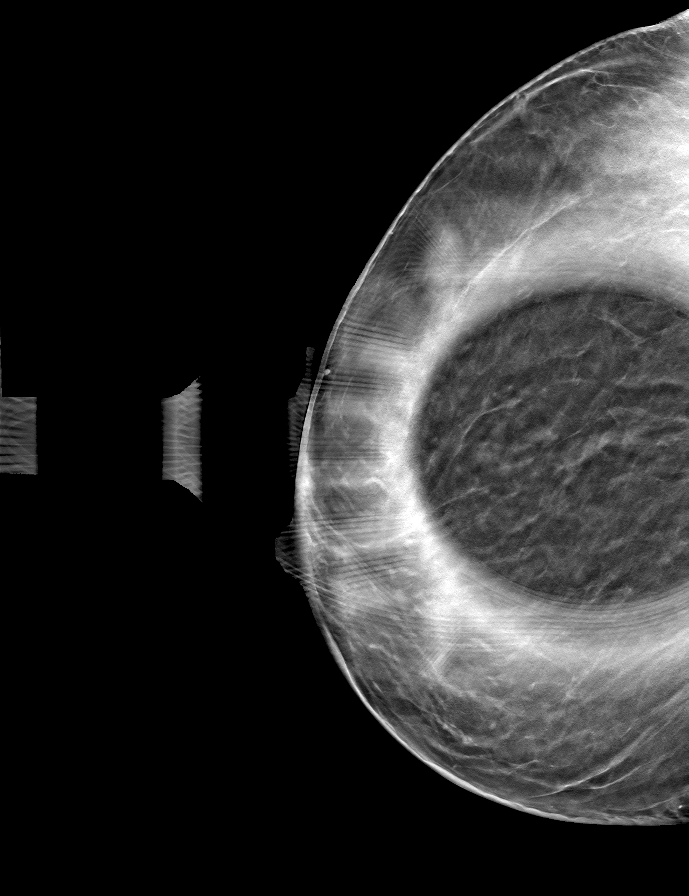

[R CC tomo · tomo slice 33/66.0]
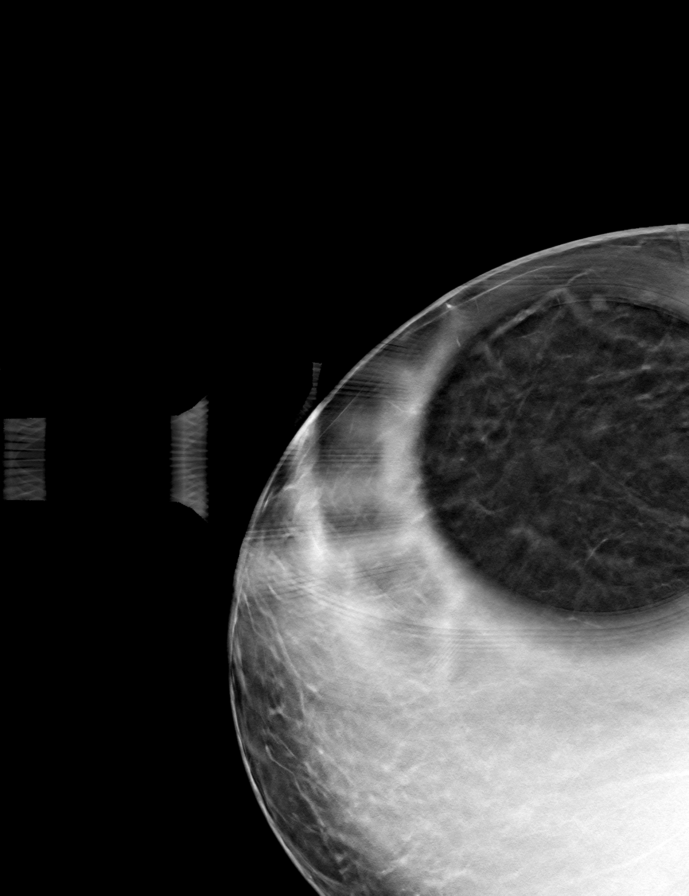

[8 of 16 positions shown; findings below may reference images not displayed]

ACR Breast Density Category b: There are scattered areas of
fibroglandular density.
FINDINGS: Additional mammographic images confirm an oval low-density mass in
the upper-outer quadrant of the right breast with associated
punctate microcalcifications. The mass measures approximately
cm. Magnification views of these calcifications show smudgy rounded
calcifications in the CC projection. The calcifications are faint in
the 90 degree lateral projection and some of them layer.

No mass additional mammographic images are performed of the axilla;
the right axilla is evaluated with ultrasound. See below.

Mammographic images were processed with CAD.

On physical exam, I do not palpate any lymphadenopathy in the right
axilla.

Targeted ultrasound is performed, showing an oval 0.6 x 0.3 x 0.6 cm
cyst with layering internal calcifications consistent with focal
fibrocystic changes at 9 o'clock position 5 cm from the nipple. This
corresponds with the 0.6 cm mass with associated microcalcifications
seen on mammography. A benign intramammary lymph node is
incidentally noted at 10 o'clock position 7 cm from the nipple.

Ultrasound of the right axilla shows 2 lymph nodes with prominent
hypoechoic cortices and attenuated fatty hila. The largest lymph
node measures approximately 2.1 x 1.2 x 2.0 cm and has a cortex that
measures up to 0.6 cm, prominent. For comparison purposes,
ultrasound was performed of the left axilla showing similar
appearing lymph nodes as those on the right, with milder cortical
thickening measuring 0.4 cm.
IMPRESSION: 1. Right greater than left axillary lymphadenopathy. While this
could be benign/chronic, ultrasound-guided core needle biopsy is
recommended to exclude malignancy/lymphoproliferative disorder.
Patient has no known medical problems which would explain the
appearance of the lymph nodes.
2. Benign fibrocystic changes in the 9 o'clock position of the right
breast.

RECOMMENDATION:
Ultrasound-guided core needle biopsy of one of the right axillary
lymph nodes is recommended. This has been scheduled for the patient.

I have discussed the findings and recommendations with the patient.
Results were also provided in writing at the conclusion of the
visit. If applicable, a reminder letter will be sent to the patient
regarding the next appointment.

BI-RADS CATEGORY  4: Suspicious.

## 2020-04-11 ENCOUNTER — Ambulatory Visit
Admission: EM | Admit: 2020-04-11 | Discharge: 2020-04-11 | Disposition: A | Discharge status: Home / Self Care | Payer: 59 | Attending: Family Medicine | Admitting: Family Medicine

## 2020-04-11 ENCOUNTER — Other Ambulatory Visit: Payer: Self-pay

## 2020-04-11 ENCOUNTER — Ambulatory Visit: Admit: 2020-04-11 | Disposition: A | Discharge status: Home / Self Care | Payer: Self-pay

## 2020-04-11 DIAGNOSIS — R42 Dizziness and giddiness: Secondary | ICD-10-CM

## 2020-04-11 DIAGNOSIS — I10 Essential (primary) hypertension: Secondary | ICD-10-CM | POA: Diagnosis not present

## 2020-04-11 DIAGNOSIS — H938X3 Other specified disorders of ear, bilateral: Secondary | ICD-10-CM

## 2020-04-11 MED ORDER — AMLODIPINE BESYLATE 5 MG PO TABS: 5.0000 mg | ORAL_TABLET | Freq: Every day | ORAL | 1 refills | Status: AC

## 2020-04-11 NOTE — Discharge Instructions (Addendum)
Starting you on amlodipine daily for your blood pressure.  Make sure you are monitoring your blood pressures daily and keeping a log. Follow-up with your primary care doctor as planned. We clean the ears out today.  Recommend start Flonase daily 2 sprays in each nostril For any continued or worsening problems please follow-up or go to the ER

## 2020-04-11 NOTE — ED Triage Notes (Signed)
Patient presents to Urgent Care with complaints of bilateral ear pain with  dizziness 01/05.    Denies fever.

## 2020-04-14 NOTE — ED Provider Notes (Signed)
[name] [name]    CSN: [id number] Arrival date & time: 04/11/20  1436      History   Chief Complaint Chief Complaint  Patient presents with  . Otalgia    HPI [name] [name] is a 50 y.o. female.   Patient is a 50 year old female with past medical history of type 2 diabetes, hypertension.  She presents today with complaints of bilateral ear pressure, mild dizziness.  This started on 1/5.  Vertigo sensation that the room is spinning.  Mild nasal congestion.  No fever, chills, cough, chest congestion. She is also hypertensive today.  Her blood pressure is 186/93.  She is not currently taking any blood pressure medication.  Denies any headache, blurred vision, chest pain or shortness of breath.  Was previously on lisinopril in the past and taken off due to allergic reaction.     Past Medical History:  Diagnosis Date  . Iron deficiency anemia   . Menorrhagia   . Type 2 diabetes mellitus (HCC)   . Uterus, adenomyosis     Patient Active Problem List   Diagnosis Date Noted  . S/P laparoscopic assisted vaginal hysterectomy (LAVH) 03/04/2014    Class: Status post  . BP (high blood pressure) 09/09/2011  . Type 2 diabetes mellitus (HCC) 09/09/2011    Past Surgical History:  Procedure Laterality Date  . CESAREAN SECTION  06-04-1998  &  10-11-2006  . FOOT SURGERY Bilateral   . LAPAROSCOPIC ASSISTED VAGINAL HYSTERECTOMY N/A 03/04/2014   Procedure: LAPAROSCOPIC ASSISTED VAGINAL HYSTERECTOMY;  Surgeon: Juluis Mire, MD;  Location: [facility];  Service: Gynecology;  Laterality: N/A;  and abdomen ports    OB History   No obstetric history on file.      Home Medications    Prior to Admission medications   Medication Sig Start Date End Date Taking? Authorizing Provider  amLODipine (NORVASC) 5 MG tablet Take 1 tablet (5 mg total) by mouth daily. 04/11/20  Yes Tyrese Ficek A, NP  ciprofloxacin (CIPRO) 500 MG tablet Take 500 mg by mouth 2 (two) times daily.  10/10/15   [provider]  docusate sodium (COLACE) 100 MG capsule Take 100 mg by mouth daily as needed for mild constipation.    Asencion Islam, DPM  ferrous sulfate 325 (65 FE) MG tablet Take 325 mg by mouth 2 (two) times daily with a meal.     [provider]  fluconazole (DIFLUCAN) 150 MG tablet TK 1 T PO QD FOR 2 DOSES 08/21/15   [provider]  HYDROcodone-acetaminophen (NORCO) 7.5-325 MG tablet Take 1 tablet by mouth every 6 (six) hours as needed for moderate pain.    Stover, Titorya, DPM  insulin aspart (NOVOLOG FLEXPEN) 100 UNIT/ML FlexPen Inject into the skin. 04/11/15   [provider]  Insulin Detemir (LEVEMIR FLEXTOUCH) 100 UNIT/ML Pen  10/20/15   [provider]  Insulin Pen Needle (FIFTY50 PEN NEEDLES) 31G X 5 MM MISC 1 Each by external route four times daily PT USES WITH NOVOLOG TID AND LEVEMIR QHS DX: E11.65 05/22/15   [provider]  Insulin Syringe-Needle U-100 (B-D INS SYR ULTRAFINE 1CC/30G) 30G X 1/2" 1 ML MISC by Does not apply route. 10/01/13   [provider]  JANUVIA 50 MG tablet TK 1 T PO QD 09/19/15   [provider]  lisinopril (PRINIVIL,ZESTRIL) 5 MG tablet Take 5 mg by mouth every morning.     [provider]  metFORMIN (GLUCOPHAGE) 1000 MG tablet Take  1,000 mg by mouth 2 (two) times daily with a meal.    [provider]  oxyCODONE-acetaminophen (PERCOCET) 7.5-325 MG per tablet Take 1 tablet by mouth every 4 (four) hours as needed for pain. 03/05/14   Richardean Chimera, MD  promethazine (PHENERGAN) 25 MG tablet Take 25 mg by mouth every 8 (eight) hours as needed for nausea or vomiting.    Asencion Islam, DPM  VICTOZA 18 MG/3ML SOPN  01/02/16   [provider]    Family History Family History  Problem Relation Age of Onset  . Breast cancer Maternal Aunt     Social History Social History   Tobacco Use  . Smoking status: Never Smoker  . Smokeless tobacco: Never Used   Substance Use Topics  . Alcohol use: No  . Drug use: No     Allergies   Patient has no known allergies.   Review of Systems Review of Systems   Physical Exam Triage Vital Signs ED Triage Vitals  Enc Vitals Group     BP 04/11/20 1520 (S) (!) 172/100     Pulse Rate 04/11/20 1520 86     Resp 04/11/20 1520 16     Temp 04/11/20 1520 97.6 F (36.4 C)     Temp Source 04/11/20 1520 Temporal     SpO2 04/11/20 1520 98 %     Weight 04/11/20 1507 165 lb (74.8 kg)     Height --      Head Circumference --      Peak Flow --      Pain Score 04/11/20 1507 3     Pain Loc --      Pain Edu? --      Excl. in GC? --    No data found.  Updated Vital Signs BP (S) (!) 186/93 (BP Location: Left Arm) Comment: Provider notified  Pulse 86   Temp 97.6 F (36.4 C) (Temporal)   Resp 16   Wt 165 lb (74.8 kg)   LMP  (LMP Unknown)   SpO2 98%   BMI 26.63 kg/m   Visual Acuity Right Eye Distance:   Left Eye Distance:   Bilateral Distance:    Right Eye Near:   Left Eye Near:    Bilateral Near:     Physical Exam Vitals and nursing note reviewed.  Constitutional:      General: She is not in acute distress.    Appearance: Normal appearance. She is not ill-appearing, toxic-appearing or diaphoretic.  HENT:     Head: Normocephalic.     Right Ear: There is impacted cerumen.     Left Ear: Tympanic membrane normal. There is impacted cerumen.     Nose: Nose normal.     Mouth/Throat:     Pharynx: Oropharynx is clear.  Eyes:     Conjunctiva/sclera: Conjunctivae normal.  Cardiovascular:     Rate and Rhythm: Normal rate and regular rhythm.  Pulmonary:     Effort: Pulmonary effort is normal.     Breath sounds: Normal breath sounds.  Musculoskeletal:        General: Normal range of motion.     Cervical back: Normal range of motion.  Skin:    General: Skin is warm and dry.     Findings: No rash.  Neurological:     General: No focal deficit present.     Mental Status: She is alert.   Psychiatric:        Mood and Affect: Mood normal.  UC Treatments / Results  Labs (all labs ordered are listed, but only abnormal results are displayed) Labs Reviewed - No data to display  EKG   Radiology No results found.  Procedures Procedures (including critical care time)  Medications Ordered in UC Medications - No data to display  Initial Impression / Assessment and Plan / UC Course  I have reviewed the triage vital signs and the nursing notes.  Pertinent labs & imaging results that were available during my care of the patient were reviewed by me and considered in my medical decision making (see chart for details).     Essential hypertension Patient hypertensive today after multiple checks.  Reading being 186/93.  Patient requesting to be started back on blood pressure medication.  She is concerned.  Feel this is appropriate at this time.  We will start her on amlodipine.  Patient has appointment to follow-up with PCP in the next couple weeks.  Recommended keep checking blood pressure at home and keep a log  Bilateral ear pressure with vertigo Most likely due to eustachian tube dysfunction. Recommended Flonase daily. Patient with bilateral cerumen impaction.  Cleaned the ears using lavage Follow up as needed for continued or worsening symptoms  Final Clinical Impressions(s) / UC Diagnoses   Final diagnoses:  Essential hypertension  Vertigo  Ear pressure, bilateral     Discharge Instructions     Starting you on amlodipine daily for your blood pressure.  Make sure you are monitoring your blood pressures daily and keeping a log. Follow-up with your primary care doctor as planned. We clean the ears out today.  Recommend start Flonase daily 2 sprays in each nostril For any continued or worsening problems please follow-up or go to the ER    ED Prescriptions    Medication Sig Dispense Auth. Provider   amLODipine (NORVASC) 5 MG tablet Take 1 tablet (5 mg  total) by mouth daily. 30 tablet Dahlia Byes A, NP     PDMP not reviewed this encounter.   Janace Aris, NP 04/14/20 [phone]

## 2020-10-05 ENCOUNTER — Encounter (HOSPITAL_COMMUNITY): Payer: Self-pay | Admitting: Emergency Medicine

## 2020-10-05 ENCOUNTER — Emergency Department (HOSPITAL_COMMUNITY): Payer: 59

## 2020-10-05 ENCOUNTER — Emergency Department (HOSPITAL_COMMUNITY)
Admission: EM | Admit: 2020-10-05 | Discharge: 2020-10-05 | Disposition: A | Discharge status: Home / Self Care | Payer: 59 | Attending: Emergency Medicine | Admitting: Emergency Medicine

## 2020-10-05 ENCOUNTER — Other Ambulatory Visit: Payer: Self-pay

## 2020-10-05 DIAGNOSIS — Z7984 Long term (current) use of oral hypoglycemic drugs: Secondary | ICD-10-CM | POA: Insufficient documentation

## 2020-10-05 DIAGNOSIS — E119 Type 2 diabetes mellitus without complications: Secondary | ICD-10-CM | POA: Insufficient documentation

## 2020-10-05 DIAGNOSIS — U071 COVID-19: Secondary | ICD-10-CM | POA: Insufficient documentation

## 2020-10-05 DIAGNOSIS — Z20822 Contact with and (suspected) exposure to covid-19: Secondary | ICD-10-CM

## 2020-10-05 DIAGNOSIS — Z794 Long term (current) use of insulin: Secondary | ICD-10-CM | POA: Diagnosis not present

## 2020-10-05 DIAGNOSIS — R6889 Other general symptoms and signs: Secondary | ICD-10-CM

## 2020-10-05 DIAGNOSIS — R000 Tachycardia, unspecified: Secondary | ICD-10-CM | POA: Diagnosis not present

## 2020-10-05 DIAGNOSIS — R059 Cough, unspecified: Secondary | ICD-10-CM | POA: Diagnosis present

## 2020-10-05 DIAGNOSIS — R0789 Other chest pain: Secondary | ICD-10-CM

## 2020-10-05 LAB — RESP PANEL BY RT-PCR (FLU A&B, COVID) ARPGX2
Influenza A by PCR: NEGATIVE
Influenza B by PCR: NEGATIVE
SARS Coronavirus 2 by RT PCR: POSITIVE — AB

## 2020-10-05 LAB — CBG MONITORING, ED: Glucose-Capillary: 139 mg/dL — ABNORMAL HIGH (ref 70–99)

## 2020-10-05 MED ORDER — IBUPROFEN 400 MG PO TABS
600.0000 mg | ORAL_TABLET | Freq: Once | ORAL | Status: AC
  Administered 2020-10-05: 600 mg via ORAL
  Filled 2020-10-05: qty 1

## 2020-10-05 NOTE — ED Triage Notes (Addendum)
Patient arrives after being directly exposed to covid . Husband tested positive last night. Pt with symptoms since Friday including fever, highest 101.2 at home, chest pain with cough, sore throat, headache. Hx of diabetes and hypertension. Diminished lung sounds noted on left side. UTD on vaccines including covid and flu, has not had covid booster/.

## 2020-10-05 NOTE — Discharge Instructions (Addendum)
Follow-up COVID and influenza testing on MyChart this evening.  Use Tylenol every 4 hours and ibuprofen every 6 hours as needed for discomfort and fever.  Return for lethargy, significant weakness, shortness of breath, diaphoresis or new concerns. Once your infection symptoms have resolved follow-up with your primary doctor for blood pressure management. Stay well-hydrated.

## 2020-10-05 NOTE — ED Provider Notes (Signed)
[facility] EMERGENCY DEPARTMENT Provider Note   CSN: [id number] Arrival date & time: 10/05/20  1636     History Chief Complaint  Patient presents with   Fever   Cough    With chest pain   Sore Throat   Headache    [name] [name] is a 50 y.o. female.  Patient with history of type 2 diabetes, high blood pressure presents with sore throat, headache, body aches, cough, mild chest discomfort only with coughing and fevers.  Family members with similar symptoms and husband tested positive last night for COVID.  Patient with family member with similar symptoms in the ER.  No shortness of breath.      Past Medical History:  Diagnosis Date   Iron deficiency anemia    Menorrhagia    Type 2 diabetes mellitus (HCC)    Uterus, adenomyosis     Patient Active Problem List   Diagnosis Date Noted   S/P laparoscopic assisted vaginal hysterectomy (LAVH) 03/04/2014    Class: Status post   BP (high blood pressure) 09/09/2011   Type 2 diabetes mellitus (HCC) 09/09/2011    Past Surgical History:  Procedure Laterality Date   CESAREAN SECTION  06-04-1998  &  10-11-2006   FOOT SURGERY Bilateral    LAPAROSCOPIC ASSISTED VAGINAL HYSTERECTOMY N/A 03/04/2014   Procedure: LAPAROSCOPIC ASSISTED VAGINAL HYSTERECTOMY;  Surgeon: Juluis Mire, MD;  Location: [facility];  Service: Gynecology;  Laterality: N/A;  and abdomen ports     OB History   No obstetric history on file.     Family History  Problem Relation Age of Onset   Breast cancer Maternal Aunt     Social History   Tobacco Use   Smoking status: Never   Smokeless tobacco: Never  Substance Use Topics   Alcohol use: No   Drug use: No    Home Medications Prior to Admission medications   Medication Sig Start Date End Date Taking? Authorizing Provider  amLODipine (NORVASC) 5 MG tablet Take 1 tablet (5 mg total) by mouth daily. 04/11/20   Dahlia Byes A, NP  ciprofloxacin (CIPRO) 500 MG tablet  Take 500 mg by mouth 2 (two) times daily. 10/10/15   [provider]  docusate sodium (COLACE) 100 MG capsule Take 100 mg by mouth daily as needed for mild constipation.    Asencion Islam, DPM  ferrous sulfate 325 (65 FE) MG tablet Take 325 mg by mouth 2 (two) times daily with a meal.     [provider]  fluconazole (DIFLUCAN) 150 MG tablet TK 1 T PO QD FOR 2 DOSES 08/21/15   [provider]  HYDROcodone-acetaminophen (NORCO) 7.5-325 MG tablet Take 1 tablet by mouth every 6 (six) hours as needed for moderate pain.    Stover, Titorya, DPM  insulin aspart (NOVOLOG FLEXPEN) 100 UNIT/ML FlexPen Inject into the skin. 04/11/15   [provider]  Insulin Detemir (LEVEMIR FLEXTOUCH) 100 UNIT/ML Pen  10/20/15   [provider]  Insulin Pen Needle (FIFTY50 PEN NEEDLES) 31G X 5 MM MISC 1 Each by external route four times daily PT USES WITH NOVOLOG TID AND LEVEMIR QHS DX: E11.65 05/22/15   [provider]  Insulin Syringe-Needle U-100 (B-D INS SYR ULTRAFINE 1CC/30G) 30G X 1/2" 1 ML MISC by Does not apply route. 10/01/13   [provider]  JANUVIA 50 MG tablet TK 1 T PO QD 09/19/15   [provider]  lisinopril (PRINIVIL,ZESTRIL) 5 MG tablet Take  5 mg by mouth every morning.     [provider]  metFORMIN (GLUCOPHAGE) 1000 MG tablet Take 1,000 mg by mouth 2 (two) times daily with a meal.    [provider]  oxyCODONE-acetaminophen (PERCOCET) 7.5-325 MG per tablet Take 1 tablet by mouth every 4 (four) hours as needed for pain. 03/05/14   Richardean Chimera, MD  promethazine (PHENERGAN) 25 MG tablet Take 25 mg by mouth every 8 (eight) hours as needed for nausea or vomiting.    Asencion Islam, DPM  VICTOZA 18 MG/3ML SOPN  01/02/16   [provider]    Allergies    Patient has no known allergies.  Review of Systems   Review of Systems  Constitutional:  Positive for fever. Negative for chills.  HENT:  Positive for congestion.    Eyes:  Negative for visual disturbance.  Respiratory:  Positive for cough. Negative for shortness of breath.   Cardiovascular:  Negative for leg swelling.  Gastrointestinal:  Negative for abdominal pain and vomiting.  Genitourinary:  Negative for dysuria and flank pain.  Musculoskeletal:  Negative for back pain, neck pain and neck stiffness.  Skin:  Negative for rash.  Neurological:  Positive for headaches. Negative for light-headedness.   Physical Exam Updated Vital Signs BP (!) 154/81 (BP Location: Left Arm)   Pulse (!) 104   Temp 100.3 F (37.9 C) (Temporal)   Resp (!) 23   Wt 77.2 kg   LMP  (LMP Unknown)   SpO2 97%   BMI 27.47 kg/m   Physical Exam Vitals and nursing note reviewed.  Constitutional:      General: She is not in acute distress.    Appearance: She is well-developed.  HENT:     Head: Normocephalic and atraumatic.     Mouth/Throat:     Mouth: Mucous membranes are dry.  Eyes:     General:        Right eye: No discharge.        Left eye: No discharge.     Conjunctiva/sclera: Conjunctivae normal.  Neck:     Trachea: No tracheal deviation.  Cardiovascular:     Rate and Rhythm: Regular rhythm. Tachycardia present.     Heart sounds: No murmur heard. Pulmonary:     Effort: Pulmonary effort is normal.     Breath sounds: Normal breath sounds.  Abdominal:     General: There is no distension.     Palpations: Abdomen is soft.     Tenderness: There is no abdominal tenderness. There is no guarding.  Musculoskeletal:     Cervical back: Normal range of motion and neck supple. No rigidity.  Skin:    General: Skin is warm.     Capillary Refill: Capillary refill takes less than 2 seconds.     Findings: No rash.  Neurological:     General: No focal deficit present.     Mental Status: She is alert.     Cranial Nerves: No cranial nerve deficit.  Psychiatric:        Mood and Affect: Mood normal.    ED Results / Procedures / Treatments   Labs (all labs ordered  are listed, but only abnormal results are displayed) Labs Reviewed  CBG MONITORING, ED - Abnormal; Notable for the following components:      Result Value   Glucose-Capillary 139 (*)    All other components within normal limits  RESP PANEL BY RT-PCR (FLU A&B, COVID) [id number]    EKG EKG Interpretation  Date/Time:  Sunday October 05 2020 17:22:20 EDT Ventricular Rate:  107 PR Interval:  157 QRS Duration: 79 QT Interval:  318 QTC Calculation: 425 R Axis:   64 Text Interpretation: Sinus tachycardia Consider left ventricular hypertrophy Confirmed by Blane Ohara [phone]) on 10/05/2020 5:24:17 PM  Radiology DG Chest Portable 1 View  Result Date: 10/05/2020 CLINICAL DATA:  Fever and cough. EXAM: PORTABLE CHEST 1 VIEW COMPARISON:  None. FINDINGS: The heart size and mediastinal contours are within normal limits. Both lungs are clear. The visualized skeletal structures are unremarkable. IMPRESSION: No active disease. Electronically Signed   By: Danae Orleans M.D.   On: 10/05/2020 17:19    Procedures Procedures   Medications Ordered in ED Medications  ibuprofen (ADVIL) tablet 600 mg (600 mg Oral Given 10/05/20 1721)    ED Course  I have reviewed the triage vital signs and the nursing notes.  Pertinent labs & imaging results that were available during my care of the patient were reviewed by me and considered in my medical decision making (see chart for details).    MDM Rules/Calculators/A&P                          Patient with history of diabetes and high blood pressure presents with flulike symptoms clinical concern for COVID with close exposure and signs consistent.  Plan for COVID/flu testing, point-of-care glucose with diabetes history and feeling generally unwell, EKG with chest pain however no concern for ACS at this time given chest pain only with coughing in addition to fever.  EKG to ensure sinus rhythm and look for any acute ischemic changes secondary to demand from infection.  Oral  fluids and ibuprofen ordered.  Chest x-ray pending.  Patient's heart rate improved in the ER with oral fluids and antipyretics.  Blood pressure elevated in the ED patient has known history of high blood pressure and is to follow-up with primary doctor for improved management after infection symptoms have resolved.  Chest x-ray reviewed no acute infiltrate, no pneumothorax, no significant cardiomegaly.  EKG reviewed mild sinus tachycardia no acute ischemic findings.  Point-of-care glucose reviewed 139.  Oral fluids given.  [name] [name] was evaluated in Emergency Department on 10/05/2020 for the symptoms described in the history of present illness. She was evaluated in the context of the global COVID-19 pandemic, which necessitated consideration that the patient might be at risk for infection with the SARS-CoV-2 virus that causes COVID-19. Institutional protocols and algorithms that pertain to the evaluation of patients at risk for COVID-19 are in a state of rapid change based on information released by regulatory bodies including the CDC and federal and state organizations. These policies and algorithms were followed during the patient's care in the ED.   Final Clinical Impression(s) / ED Diagnoses Final diagnoses:  Flu-like symptoms  Close exposure to COVID-19 virus  Chest wall pain    Rx / DC Orders ED Discharge Orders     None        Blane Ohara, MD 10/05/20 1827

## 2021-07-03 ENCOUNTER — Ambulatory Visit (HOSPITAL_COMMUNITY)
Admission: EM | Admit: 2021-07-03 | Discharge: 2021-07-03 | Disposition: A | Discharge status: Home / Self Care | Payer: 59 | Attending: Nurse Practitioner | Admitting: Nurse Practitioner

## 2021-07-03 ENCOUNTER — Other Ambulatory Visit: Payer: Self-pay

## 2021-07-03 ENCOUNTER — Inpatient Hospital Stay (HOSPITAL_COMMUNITY)
Admission: RE | Admit: 2021-07-03 | Discharge: 2021-07-03 | Disposition: A | Discharge status: Home / Self Care | Payer: Self-pay | Source: Ambulatory Visit

## 2021-07-03 ENCOUNTER — Encounter (HOSPITAL_COMMUNITY): Payer: Self-pay | Admitting: Emergency Medicine

## 2021-07-03 DIAGNOSIS — H5789 Other specified disorders of eye and adnexa: Secondary | ICD-10-CM | POA: Diagnosis not present

## 2021-07-03 DIAGNOSIS — H5712 Ocular pain, left eye: Secondary | ICD-10-CM | POA: Diagnosis not present

## 2021-07-03 MED ORDER — EYE WASH OPHTH SOLN
OPHTHALMIC | Status: AC
  Filled 2021-07-03: qty 118

## 2021-07-03 MED ORDER — OFLOXACIN 0.3 % OP SOLN: 1.0000 [drp] | Freq: Four times a day (QID) | OPHTHALMIC | 0 refills | Status: AC

## 2021-07-03 MED ORDER — ERYTHROMYCIN 5 MG/GM OP OINT: 1.0000 "application " | TOPICAL_OINTMENT | Freq: Every day | OPHTHALMIC | 0 refills | Status: AC

## 2021-07-03 NOTE — Discharge Instructions (Addendum)
Use medication as prescribed. ?Do not rub or irritate the iris while symptoms persist. ?Cool compresses to the affected eye to help with pain and inflammation. ?Do not wear your contacts until you have followed up with ophthalmology. ?If you continue to have symptoms, or worsening symptoms, you will need to go to the ER immediately, to include loss of vision, change in vision, worsening eye pain, worsening redness or other concerns. ?If you continue to have symptoms within the next 48 to 72 hours, you will need to contact her eye doctor.  You can also contact Dr. Allena Katz at [phone] for a follow-up appointment. ?

## 2021-07-03 NOTE — ED Triage Notes (Signed)
Pt reports left eye felt like something was in it earlier and was really watery. Pt reports pain and redness. ?

## 2021-07-03 NOTE — ED Provider Notes (Signed)
?[place] ? ? ? ?CSN: [id number] ?Arrival date & time: 07/03/21  1513 ? ? ?  ? ?History   ?Chief Complaint ?Chief Complaint  ?Patient presents with  ? Eye Problem  ? ? ?HPI ?[name] [name] is a 51 y.o. female.  ? ?The patient is a 51 year old female who presents for complaints of left eye pain and redness.  Patient states she woke up this morning around 3 AM with sudden pain in the left eye.  She also had redness, and a feeling of foreign body in the left eye.  She also has tearing in the left eye, which has somewhat improved.  She denies fever, chills, loss of vision, change of vision, blurred vision, headache.  She states that she wears her contact lenses sometimes on the weekend, she wore her contacts lens last 6 days ago.  She normally wears her eyeglasses the other times.  She has not used any medication for her symptoms. ? ? ?Eye Problem ?Location:  Left eye ?Quality:  Foreign body sensation ?Severity:  Moderate ?Progression:  Waxing and waning ?Context: foreign body   ?Context: not contact lens problem   ?Relieved by:  Nothing ?Worsened by:  Nothing ?Ineffective treatments:  None tried ?Associated symptoms: discharge (clear), inflammation and redness   ?Associated symptoms: no blurred vision, no decreased vision, no headaches, no photophobia and no swelling   ?Risk factors: no exposure to pinkeye, no previous injury to eye and no recent URI   ? ?Past Medical History:  ?Diagnosis Date  ? Iron deficiency anemia   ? Menorrhagia   ? Type 2 diabetes mellitus ([place])   ? Uterus, adenomyosis   ? ? ?Patient Active Problem List  ? Diagnosis Date Noted  ? S/P laparoscopic assisted vaginal hysterectomy (LAVH) 03/04/2014  ?  Class: Status post  ? BP (high blood pressure) 09/09/2011  ? Type 2 diabetes mellitus ([place]) 09/09/2011  ? ? ?Past Surgical History:  ?Procedure Laterality Date  ? CESAREAN SECTION  06-04-1998  &  10-11-2006  ? FOOT SURGERY Bilateral   ? LAPAROSCOPIC ASSISTED VAGINAL HYSTERECTOMY N/A  03/04/2014  ? Procedure: LAPAROSCOPIC ASSISTED VAGINAL HYSTERECTOMY;  Surgeon: Darlyn Chamber, MD;  Location: [facility];  Service: Gynecology;  Laterality: N/A;  and abdomen ports  ? ? ?OB History   ?No obstetric history on file. ?  ? ? ? ?Home Medications   ? ?Prior to Admission medications   ?Medication Sig Start Date End Date Taking? Authorizing Provider  ?amLODipine (NORVASC) 5 MG tablet Take 1 tablet (5 mg total) by mouth daily. 04/11/20   Loura Halt A, NP  ?ciprofloxacin (CIPRO) 500 MG tablet Take 500 mg by mouth 2 (two) times daily. 10/10/15   [provider]  ?docusate sodium (COLACE) 100 MG capsule Take 100 mg by mouth daily as needed for mild constipation.    Landis Martins, DPM  ?ferrous sulfate 325 (65 FE) MG tablet Take 325 mg by mouth 2 (two) times daily with a meal.     [provider]  ?fluconazole (DIFLUCAN) 150 MG tablet TK 1 T PO QD FOR 2 DOSES 08/21/15   [provider]  ?HYDROcodone-acetaminophen (NORCO) 7.5-325 MG tablet Take 1 tablet by mouth every 6 (six) hours as needed for moderate pain.    Landis Martins, DPM  ?insulin aspart (NOVOLOG FLEXPEN) 100 UNIT/ML FlexPen Inject into the skin. 04/11/15   [provider]  ?Insulin Detemir (LEVEMIR FLEXTOUCH) 100 UNIT/ML Pen  10/20/15   [provider]  ?Insulin Pen Needle (FIFTY50 PEN NEEDLES) 31G X 5 MM MISC 1 Each by external route four times daily PT USES WITH NOVOLOG TID AND LEVEMIR QHS DX: E11.65 05/22/15   [provider]  ?Insulin Syringe-Needle U-100 (B-D INS SYR ULTRAFINE 1CC/30G) 30G X 1/2" 1 ML MISC by Does not apply route. 10/01/13   [provider]  ?JANUVIA 50 MG tablet TK 1 T PO QD 09/19/15   [provider]  ?lisinopril (PRINIVIL,ZESTRIL) 5 MG tablet Take 5 mg by mouth every morning.     [provider]  ?metFORMIN (GLUCOPHAGE) 1000 MG tablet Take 1,000 mg by mouth 2 (two) times daily with a meal.    [provider]   ?oxyCODONE-acetaminophen (PERCOCET) 7.5-325 MG per tablet Take 1 tablet by mouth every 4 (four) hours as needed for pain. 03/05/14   Arvella Nigh, MD  ?promethazine (PHENERGAN) 25 MG tablet Take 25 mg by mouth every 8 (eight) hours as needed for nausea or vomiting.    Landis Martins, DPM  ?VICTOZA 18 MG/3ML SOPN  01/02/16   [provider]  ? ? ?Family History ?Family History  ?Problem Relation Age of Onset  ? Breast cancer Maternal Aunt   ? ? ?Social History ?Social History  ? ?Tobacco Use  ? Smoking status: Never  ? Smokeless tobacco: Never  ?Substance Use Topics  ? Alcohol use: No  ? Drug use: No  ? ? ? ?Allergies   ?Patient has no known allergies. ? ? ?Review of Systems ?Review of Systems  ?Constitutional: Negative.   ?HENT: Negative.    ?Eyes:  Positive for pain, discharge (clear) and redness. Negative for blurred vision, photophobia and visual disturbance.  ?Skin: Negative.   ?Neurological:  Negative for headaches.  ?Psychiatric/Behavioral: Negative.    ? ? ?Physical Exam ?Triage Vital Signs ?ED Triage Vitals  ?Enc Vitals Group  ?   BP 07/03/21 1555 131/76  ?   Pulse Rate 07/03/21 1555 88  ?   Resp 07/03/21 1555 17  ?   Temp 07/03/21 1555 98.5 ?F (36.9 ?C)  ?   Temp Source 07/03/21 1555 Oral  ?   SpO2 07/03/21 1555 100 %  ?   Weight --   ?   Height --   ?   Head Circumference --   ?   Peak Flow --   ?   Pain Score 07/03/21 1554 8  ?   Pain Loc --   ?   Pain Edu? --   ?   Excl. in [place]? --   ? ?No data found. ? ?Updated Vital Signs ?BP 131/76 (BP Location: Left Arm)   Pulse 88   Temp 98.5 ?F (36.9 ?C) (Oral)   Resp 17   LMP  (LMP Unknown)   SpO2 100%  ? ?Visual Acuity ?Right Eye Distance:   ?Left Eye Distance:   ?Bilateral Distance:   ? ?Right Eye Near:   ?Left Eye Near:    ?Bilateral Near:    ? ?Physical Exam ?Vitals reviewed.  ?Constitutional:   ?   General: She is not in acute distress. ?   Appearance: Normal appearance.  ?HENT:  ?   Head: Normocephalic and atraumatic.  ?Eyes:  ?   General: Lids  are normal. Lids are everted, no foreign bodies appreciated. Vision grossly intact. Gaze aligned appropriately. No visual field deficit.    ?   Left eye: Discharge present.No hordeolum.  ?   Extraocular Movements: Extraocular movements intact.  ?  Conjunctiva/sclera:  ?   Left eye: Left conjunctiva is not injected. No chemosis. ?   Pupils: Pupils are equal, round, and reactive to light.  ? ?   Comments: Fluorescein stain performed. Uptake seen in the pupil of the left eye. + ring around the pupil of the left eye. Unable to manipulate with q-tip. Irrigation performed with irrigation bottle to the left eye, no change to area around the pupil. + erythema, tearing to the left eye.   ?Neurological:  ?   Mental Status: She is alert.  ? ? ? ?UC Treatments / Results  ?Labs ?(all labs ordered are listed, but only abnormal results are displayed) ?Labs Reviewed - No data to display ? ?EKG ? ? ?Radiology ?No results found. ? ?Procedures ?Procedures (including critical care time) ? ?Medications Ordered in UC ?Medications - No data to display ? ?Initial Impression / Assessment and Plan / UC Course  ?I have reviewed the triage vital signs and the nursing notes. ? ?Pertinent labs & imaging results that were available during my care of the patient were reviewed by me and considered in my medical decision making (see chart for details). ? ?The patient is a 51 year old female who presents with pain to the left thigh.  Symptoms started suddenly this morning upon wakening.  Fluorescein uptake done with uptake noticed in the center of the patient's pupil.  Irrigation was performed, there was an unusual ring around the pupil of the left eye that could not be manipulated with irrigation or with the use of a Q-tip.  Patient continues to complain of foreign body or feeling something is in the left eye.  Called Dr. Posey Pronto, on-call ophthalmologist, he recommended using Ocuflox and erythromycin ointment for treatment.  Recommended that patient  follow-up in the ER if she develops worsening symptom  Patient was given Dr. Serita Grit number to call if she has symptoms or needs follow-up on 4/3.  Dr. Serita Grit number was provided.  In the interim, patient advised to follow-up in th

## 2021-10-28 ENCOUNTER — Other Ambulatory Visit: Payer: Self-pay | Admitting: Gastroenterology

## 2021-10-28 DIAGNOSIS — R1011 Right upper quadrant pain: Secondary | ICD-10-CM

## 2021-11-11 ENCOUNTER — Ambulatory Visit
Admission: RE | Admit: 2021-11-11 | Discharge: 2021-11-11 | Disposition: A | Discharge status: Home / Self Care | Payer: 59 | Source: Ambulatory Visit | Attending: Gastroenterology | Admitting: Gastroenterology

## 2021-11-11 DIAGNOSIS — R1011 Right upper quadrant pain: Secondary | ICD-10-CM

## 2022-01-20 ENCOUNTER — Other Ambulatory Visit (HOSPITAL_BASED_OUTPATIENT_CLINIC_OR_DEPARTMENT_OTHER): Payer: Self-pay | Admitting: Family Medicine

## 2022-01-20 DIAGNOSIS — R1011 Right upper quadrant pain: Secondary | ICD-10-CM

## 2022-01-27 ENCOUNTER — Ambulatory Visit (HOSPITAL_BASED_OUTPATIENT_CLINIC_OR_DEPARTMENT_OTHER): Payer: 59

## 2022-02-01 ENCOUNTER — Other Ambulatory Visit: Payer: Self-pay

## 2022-02-01 ENCOUNTER — Ambulatory Visit (HOSPITAL_COMMUNITY)
Admission: EM | Admit: 2022-02-01 | Discharge: 2022-02-01 | Disposition: A | Discharge status: Home / Self Care | Payer: 59 | Attending: Internal Medicine | Admitting: Internal Medicine

## 2022-02-01 ENCOUNTER — Encounter (HOSPITAL_COMMUNITY): Payer: Self-pay | Admitting: Emergency Medicine

## 2022-02-01 DIAGNOSIS — J039 Acute tonsillitis, unspecified: Secondary | ICD-10-CM | POA: Diagnosis not present

## 2022-02-01 DIAGNOSIS — J029 Acute pharyngitis, unspecified: Secondary | ICD-10-CM | POA: Diagnosis not present

## 2022-02-01 DIAGNOSIS — R051 Acute cough: Secondary | ICD-10-CM | POA: Diagnosis present

## 2022-02-01 DIAGNOSIS — Z1152 Encounter for screening for COVID-19: Secondary | ICD-10-CM | POA: Diagnosis not present

## 2022-02-01 DIAGNOSIS — J069 Acute upper respiratory infection, unspecified: Secondary | ICD-10-CM | POA: Insufficient documentation

## 2022-02-01 LAB — POCT RAPID STREP A, ED / UC: Streptococcus, Group A Screen (Direct): NEGATIVE

## 2022-02-01 MED ORDER — BENZONATATE 100 MG PO CAPS: 100.0000 mg | ORAL_CAPSULE | Freq: Three times a day (TID) | ORAL | 0 refills | Status: DC | PRN

## 2022-02-01 MED ORDER — AMOXICILLIN-POT CLAVULANATE 875-125 MG PO TABS: 1.0000 | ORAL_TABLET | Freq: Two times a day (BID) | ORAL | 0 refills | Status: DC

## 2022-02-01 NOTE — ED Triage Notes (Signed)
01/27/2022 is when symptoms started.  Complains of cough , congestion, some runny nose, sore throat and laryngitis.  Symptoms worsened yesterday.  Has had tylenol cold medicine and cough drops.   Patient did not do a covid test.

## 2022-02-01 NOTE — Discharge Instructions (Signed)
Rapid strep is negative but I am still suspicious of strep given appearance of your throat on exam as we discussed.  I am treating with antibiotic.  COVID test is pending.  We will call if it is positive.  Please follow-up if symptoms persist or worsen.

## 2022-02-01 NOTE — ED Provider Notes (Signed)
[place]    CSN: [id number] Arrival date & time: 02/01/22  1159      History   Chief Complaint Chief Complaint  Patient presents with   Cough    HPI [name] [name] is a 51 y.o. female.   Patient presents with cough, nasal congestion, runny nose, sore throat, hoarseness that started about 5 days prior.  She reports that her family member has had similar symptoms.  She has taken Tylenol, used over-the-counter cough medication, and cough drops with minimal improvement of symptoms.  Denies any known fevers at home.  Denies chest pain, shortness of breath, ear pain, nausea, vomiting, diarrhea, abdominal pain.   Cough   Past Medical History:  Diagnosis Date   Iron deficiency anemia    Menorrhagia    Type 2 diabetes mellitus ([place])    Uterus, adenomyosis     Patient Active Problem List   Diagnosis Date Noted   S/P laparoscopic assisted vaginal hysterectomy (LAVH) 03/04/2014    Class: Status post   BP (high blood pressure) 09/09/2011   Type 2 diabetes mellitus ([place]) 09/09/2011    Past Surgical History:  Procedure Laterality Date   CESAREAN SECTION  06-04-1998  &  10-11-2006   FOOT SURGERY Bilateral    LAPAROSCOPIC ASSISTED VAGINAL HYSTERECTOMY N/A 03/04/2014   Procedure: LAPAROSCOPIC ASSISTED VAGINAL HYSTERECTOMY;  Surgeon: Darlyn Chamber, MD;  Location: [facility];  Service: Gynecology;  Laterality: N/A;  and abdomen ports    OB History   No obstetric history on file.      Home Medications    Prior to Admission medications   Medication Sig Start Date End Date Taking? Authorizing Provider  amoxicillin-clavulanate (AUGMENTIN) 875-125 MG tablet Take 1 tablet by mouth every 12 (twelve) hours. 02/01/22  Yes Nalanie Winiecki, Hildred Alamin E, FNP  benzonatate (TESSALON) 100 MG capsule Take 1 capsule (100 mg total) by mouth every 8 (eight) hours as needed for cough. 02/01/22  Yes Joeleen Wortley, Hildred Alamin E, FNP  amLODipine (NORVASC) 5 MG tablet Take 1 tablet (5 mg total)  by mouth daily. 04/11/20   Loura Halt A, NP  ciprofloxacin (CIPRO) 500 MG tablet Take 500 mg by mouth 2 (two) times daily. 10/10/15   [provider]  docusate sodium (COLACE) 100 MG capsule Take 100 mg by mouth daily as needed for mild constipation.    Landis Martins, DPM  ferrous sulfate 325 (65 FE) MG tablet Take 325 mg by mouth 2 (two) times daily with a meal.     [provider]  fluconazole (DIFLUCAN) 150 MG tablet TK 1 T PO QD FOR 2 DOSES Patient not taking: Reported on 02/01/2022 08/21/15   [provider]  HYDROcodone-acetaminophen (NORCO) 7.5-325 MG tablet Take 1 tablet by mouth every 6 (six) hours as needed for moderate pain.    Stover, Titorya, DPM  insulin aspart (NOVOLOG FLEXPEN) 100 UNIT/ML FlexPen Inject into the skin. 04/11/15   [provider]  Insulin Detemir (LEVEMIR FLEXTOUCH) 100 UNIT/ML Pen  10/20/15   [provider]  Insulin Pen Needle (FIFTY50 PEN NEEDLES) 31G X 5 MM MISC 1 Each by external route four times daily PT USES WITH NOVOLOG TID AND LEVEMIR QHS DX: E11.65 05/22/15   [provider]  Insulin Syringe-Needle U-100 (B-D INS SYR ULTRAFINE 1CC/30G) 30G X 1/2" 1 ML MISC by Does not apply route. 10/01/13   [provider]  JANUVIA 50 MG tablet TK 1 T PO QD 09/19/15   [provider]  lisinopril (PRINIVIL,ZESTRIL) 5 MG tablet Take 5 mg by mouth every morning.  Patient not taking: Reported on 02/01/2022    [provider]  metFORMIN (GLUCOPHAGE) 1000 MG tablet Take 1,000 mg by mouth 2 (two) times daily with a meal.    [provider]  oxyCODONE-acetaminophen (PERCOCET) 7.5-325 MG per tablet Take 1 tablet by mouth every 4 (four) hours as needed for pain. 03/05/14   Richardean Chimera, MD  promethazine (PHENERGAN) 25 MG tablet Take 25 mg by mouth every 8 (eight) hours as needed for nausea or vomiting.    Asencion Islam, DPM  VICTOZA 18 MG/3ML SOPN  01/02/16   [provider]    Family  History Family History  Problem Relation Age of Onset   Breast cancer Maternal Aunt     Social History Social History   Tobacco Use   Smoking status: Never   Smokeless tobacco: Never  Vaping Use   Vaping Use: Never used  Substance Use Topics   Alcohol use: No   Drug use: No     Allergies   Lisinopril   Review of Systems Review of Systems Per HPI  Physical Exam Triage Vital Signs ED Triage Vitals  Enc Vitals Group     BP 02/01/22 1404 (!) 150/87     Pulse Rate 02/01/22 1404 (!) 101     Resp 02/01/22 1404 20     Temp 02/01/22 1404 99.1 F (37.3 C)     Temp Source 02/01/22 1404 Oral     SpO2 02/01/22 1404 98 %     Weight --      Height --      Head Circumference --      Peak Flow --      Pain Score 02/01/22 1401 8     Pain Loc --      Pain Edu? --      Excl. in GC? --    No data found.  Updated Vital Signs BP (!) 150/87 (BP Location: Left Arm)   Pulse (!) 101   Temp 99.1 F (37.3 C) (Oral)   Resp 20   LMP  (LMP Unknown)   SpO2 98%   Visual Acuity Right Eye Distance:   Left Eye Distance:   Bilateral Distance:    Right Eye Near:   Left Eye Near:    Bilateral Near:     Physical Exam Constitutional:      General: She is not in acute distress.    Appearance: Normal appearance. She is not toxic-appearing or diaphoretic.  HENT:     Head: Normocephalic and atraumatic.     Right Ear: Tympanic membrane and ear canal normal.     Left Ear: Tympanic membrane and ear canal normal.     Nose: Congestion present.     Mouth/Throat:     Mouth: Mucous membranes are moist.     Pharynx: Posterior oropharyngeal erythema present. No pharyngeal swelling or oropharyngeal exudate.     Tonsils: Tonsillar exudate present. No tonsillar abscesses. 1+ on the right. 1+ on the left.  Eyes:     Extraocular Movements: Extraocular movements intact.     Conjunctiva/sclera: Conjunctivae normal.     Pupils: Pupils are equal, round, and reactive to light.  Cardiovascular:      Rate and Rhythm: Normal rate and regular rhythm.     Pulses: Normal pulses.     Heart sounds: Normal heart sounds.  Pulmonary:     Effort: Pulmonary effort is normal. No respiratory  distress.     Breath sounds: Normal breath sounds. No wheezing.  Abdominal:     General: Abdomen is flat. Bowel sounds are normal.     Palpations: Abdomen is soft.  Musculoskeletal:        General: Normal range of motion.     Cervical back: Normal range of motion.  Skin:    General: Skin is warm and dry.  Neurological:     General: No focal deficit present.     Mental Status: She is alert and oriented to person, place, and time. Mental status is at baseline.  Psychiatric:        Mood and Affect: Mood normal.        Behavior: Behavior normal.      UC Treatments / Results  Labs (all labs ordered are listed, but only abnormal results are displayed) Labs Reviewed  CULTURE, GROUP A STREP ([facility])  SARS CORONAVIRUS 2 (TAT 6-24 HRS)  POCT RAPID STREP A, ED / UC    EKG   Radiology No results found.  Procedures Procedures (including critical care time)  Medications Ordered in UC Medications - No data to display  Initial Impression / Assessment and Plan / UC Course  I have reviewed the triage vital signs and the nursing notes.  Pertinent labs & imaging results that were available during my care of the patient were reviewed by me and considered in my medical decision making (see chart for details).     Rapid strep test was negative but I am still suspicious of strep throat given the appearance of patient's posterior pharynx and tonsils on exam.  I am opting to treat with Augmentin antibiotic.  Throat culture is pending.  Discussed with patient that there is a possibility that viral illness could be causing symptoms as well.  Discussed supportive care and symptom management with patient.  COVID test is pending.  There are no signs of peritonsillar abscess on exam.  Advised patient to follow-up if  symptoms persist or worsen.  Patient verbalized understanding and was agreeable with plan. Final Clinical Impressions(s) / UC Diagnoses   Final diagnoses:  Acute tonsillitis, unspecified etiology  Acute cough  Acute upper respiratory infection  Sore throat     Discharge Instructions      Rapid strep is negative but I am still suspicious of strep given appearance of your throat on exam as we discussed.  I am treating with antibiotic.  COVID test is pending.  We will call if it is positive.  Please follow-up if symptoms persist or worsen.     ED Prescriptions     Medication Sig Dispense Auth. Provider   amoxicillin-clavulanate (AUGMENTIN) 875-125 MG tablet Take 1 tablet by mouth every 12 (twelve) hours. 14 tablet [place], Wolcott E, [place]   benzonatate (TESSALON) 100 MG capsule Take 1 capsule (100 mg total) by mouth every 8 (eight) hours as needed for cough. 21 capsule [place], Acie Fredrickson, [place]      PDMP not reviewed this encounter.   Gustavus Bryant, [place] 02/01/22 1540

## 2022-02-01 NOTE — ED Notes (Signed)
Covid swab obtained, labeled at bedside, placed in lab

## 2022-02-02 LAB — SARS CORONAVIRUS 2 (TAT 6-24 HRS): SARS Coronavirus 2: NEGATIVE

## 2022-02-04 LAB — CULTURE, GROUP A STREP (THRC)

## 2022-02-05 ENCOUNTER — Ambulatory Visit (INDEPENDENT_AMBULATORY_CARE_PROVIDER_SITE_OTHER): Payer: 59

## 2022-02-05 ENCOUNTER — Ambulatory Visit (HOSPITAL_COMMUNITY)
Admission: RE | Admit: 2022-02-05 | Discharge: 2022-02-05 | Disposition: A | Discharge status: Home / Self Care | Payer: 59 | Source: Ambulatory Visit | Attending: Physician Assistant | Admitting: Physician Assistant

## 2022-02-05 ENCOUNTER — Encounter (HOSPITAL_COMMUNITY): Payer: Self-pay

## 2022-02-05 VITALS — BP 136/74 | HR 85 | Temp 98.7°F | Resp 15

## 2022-02-05 DIAGNOSIS — R059 Cough, unspecified: Secondary | ICD-10-CM | POA: Diagnosis not present

## 2022-02-05 DIAGNOSIS — J209 Acute bronchitis, unspecified: Secondary | ICD-10-CM | POA: Diagnosis not present

## 2022-02-05 MED ORDER — PREDNISONE 20 MG PO TABS: 40.0000 mg | ORAL_TABLET | Freq: Every day | ORAL | 0 refills | Status: AC

## 2022-02-05 MED ORDER — ALBUTEROL SULFATE HFA 108 (90 BASE) MCG/ACT IN AERS
2.0000 | INHALATION_SPRAY | Freq: Once | RESPIRATORY_TRACT | Status: AC
  Administered 2022-02-05: 2 via RESPIRATORY_TRACT

## 2022-02-05 MED ORDER — ALBUTEROL SULFATE HFA 108 (90 BASE) MCG/ACT IN AERS
INHALATION_SPRAY | RESPIRATORY_TRACT | Status: AC
  Filled 2022-02-05: qty 6.7

## 2022-02-05 NOTE — Discharge Instructions (Signed)
Your x-ray was normal. Continue the albuterol every 4-6 hours for the next few days. Use Mucinex and Flonase over the counter. Start Prednisone. This will make your blood sugar high. Drink lots of water and avoid carbohydrates while on this medication.  You should also avoid NSAIDs including aspirin, ibuprofen/Advil, naproxen/Aleve.  You can stop your antibiotic.  You can continue Tessalon for cough.  If anything changes please return for reevaluation.

## 2022-02-05 NOTE — ED Provider Notes (Signed)
[facility]    CSN: [id number] Arrival date & time: 02/05/22  0931      History   Chief Complaint Chief Complaint  Patient presents with   Cough    Entered by patient    HPI [name] [name] is a 51 y.o. female.   Patient presents today with a weeklong history of cough.  She was seen by clinic on 02/01/2022 at which point she tested negative for COVID and strep.  She was started on Augmentin and Tessalon for cough.  Despite medication she continues to have significant cough symptoms.  Reports that this is worse at night.  She is no longer taking over-the-counter medications but had been using OTC multisymptom medication without improvement.  Denies additional antibiotic use outside of what was prescribed at her last visit.  Denies any recent steroids.  She does have a history of diabetes but reports her blood sugars are well controlled her last A1c was 6.7% 01/15/2022.  She denies history of asthma, COPD, smoking.  She reports symptoms are worsening prompting evaluation today.    Past Medical History:  Diagnosis Date   Iron deficiency anemia    Menorrhagia    Type 2 diabetes mellitus ([facility])    Uterus, adenomyosis     Patient Active Problem List   Diagnosis Date Noted   S/P laparoscopic assisted vaginal hysterectomy (LAVH) 03/04/2014    Class: Status post   BP (high blood pressure) 09/09/2011   Type 2 diabetes mellitus ([facility]) 09/09/2011    Past Surgical History:  Procedure Laterality Date   CESAREAN SECTION  06-04-1998  &  10-11-2006   FOOT SURGERY Bilateral    LAPAROSCOPIC ASSISTED VAGINAL HYSTERECTOMY N/A 03/04/2014   Procedure: LAPAROSCOPIC ASSISTED VAGINAL HYSTERECTOMY;  Surgeon: Juluis Mire, MD;  Location: [facility];  Service: Gynecology;  Laterality: N/A;  and abdomen ports    OB History   No obstetric history on file.      Home Medications    Prior to Admission medications   Medication Sig Start Date End Date Taking?  Authorizing Provider  predniSONE (DELTASONE) 20 MG tablet Take 2 tablets (40 mg total) by mouth daily for 4 days. 02/05/22 02/09/22 Yes Lekita Kerekes K, PA-C  amLODipine (NORVASC) 5 MG tablet Take 1 tablet (5 mg total) by mouth daily. 04/11/20   Dahlia Byes A, NP  benzonatate (TESSALON) 100 MG capsule Take 1 capsule (100 mg total) by mouth every 8 (eight) hours as needed for cough. 02/01/22   Gustavus Bryant, FNP  docusate sodium (COLACE) 100 MG capsule Take 100 mg by mouth daily as needed for mild constipation.    Asencion Islam, DPM  ferrous sulfate 325 (65 FE) MG tablet Take 325 mg by mouth 2 (two) times daily with a meal.     [provider]  HYDROcodone-acetaminophen (NORCO) 7.5-325 MG tablet Take 1 tablet by mouth every 6 (six) hours as needed for moderate pain.    Stover, Titorya, DPM  insulin aspart (NOVOLOG FLEXPEN) 100 UNIT/ML FlexPen Inject into the skin. 04/11/15   [provider]  Insulin Detemir (LEVEMIR FLEXTOUCH) 100 UNIT/ML Pen  10/20/15   [provider]  Insulin Pen Needle (FIFTY50 PEN NEEDLES) 31G X 5 MM MISC 1 Each by external route four times daily PT USES WITH NOVOLOG TID AND LEVEMIR QHS DX: E11.65 05/22/15   [provider]  Insulin Syringe-Needle U-100 (B-D INS SYR ULTRAFINE 1CC/30G) 30G X 1/2" 1 ML MISC by Does not apply  route. 10/01/13   [provider]  JANUVIA 50 MG tablet TK 1 T PO QD 09/19/15   [provider]  lisinopril (PRINIVIL,ZESTRIL) 5 MG tablet Take 5 mg by mouth every morning.  Patient not taking: Reported on 02/01/2022    [provider]  metFORMIN (GLUCOPHAGE) 1000 MG tablet Take 1,000 mg by mouth 2 (two) times daily with a meal.    [provider]  oxyCODONE-acetaminophen (PERCOCET) 7.5-325 MG per tablet Take 1 tablet by mouth every 4 (four) hours as needed for pain. 03/05/14   Richardean Chimera, MD  promethazine (PHENERGAN) 25 MG tablet Take 25 mg by mouth every 8 (eight) hours as needed for nausea or  vomiting.    Asencion Islam, DPM  VICTOZA 18 MG/3ML SOPN  01/02/16   [provider]    Family History Family History  Problem Relation Age of Onset   Breast cancer Maternal Aunt     Social History Social History   Tobacco Use   Smoking status: Never   Smokeless tobacco: Never  Vaping Use   Vaping Use: Never used  Substance Use Topics   Alcohol use: No   Drug use: No     Allergies   Lisinopril   Review of Systems Review of Systems  Constitutional:  Positive for activity change and fatigue. Negative for appetite change and fever.  HENT:  Positive for congestion. Negative for sinus pressure, sneezing and sore throat.   Respiratory:  Positive for cough and chest tightness. Negative for shortness of breath and wheezing.   Cardiovascular:  Negative for chest pain.  Gastrointestinal:  Negative for abdominal pain, diarrhea, nausea and vomiting.  Neurological:  Negative for dizziness, light-headedness and headaches.     Physical Exam Triage Vital Signs ED Triage Vitals [02/05/22 1011]  Enc Vitals Group     BP 136/74     Pulse Rate 85     Resp 15     Temp 98.7 F (37.1 C)     Temp Source Oral     SpO2 98 %     Weight      Height      Head Circumference      Peak Flow      Pain Score      Pain Loc      Pain Edu?      Excl. in GC?    No data found.  Updated Vital Signs BP 136/74 (BP Location: Left Arm)   Pulse 85   Temp 98.7 F (37.1 C) (Oral)   Resp 15   LMP  (LMP Unknown)   SpO2 98%   Visual Acuity Right Eye Distance:   Left Eye Distance:   Bilateral Distance:    Right Eye Near:   Left Eye Near:    Bilateral Near:     Physical Exam Vitals reviewed.  Constitutional:      General: She is awake. She is not in acute distress.    Appearance: Normal appearance. She is well-developed. She is not ill-appearing.     Comments: Very pleasant female appears stated age no acute distress sitting comfortably in exam room  HENT:     Head:  Normocephalic and atraumatic.     Right Ear: Tympanic membrane, ear canal and external ear normal. Tympanic membrane is not erythematous or bulging.     Left Ear: Tympanic membrane, ear canal and external ear normal. Tympanic membrane is not erythematous or bulging.     Nose:  Right Sinus: No maxillary sinus tenderness or frontal sinus tenderness.     Left Sinus: No maxillary sinus tenderness or frontal sinus tenderness.     Mouth/Throat:     Pharynx: Uvula midline. Posterior oropharyngeal erythema present. No oropharyngeal exudate.  Cardiovascular:     Rate and Rhythm: Normal rate and regular rhythm.     Heart sounds: Normal heart sounds, S1 normal and S2 normal. No murmur heard. Pulmonary:     Effort: Pulmonary effort is normal.     Breath sounds: No wheezing, rhonchi or rales.     Comments: Reactive cough with deep breathing Psychiatric:        Behavior: Behavior is cooperative.      UC Treatments / Results  Labs (all labs ordered are listed, but only abnormal results are displayed) Labs Reviewed - No data to display  EKG   Radiology DG Chest 2 View  Result Date: 02/05/2022 CLINICAL DATA:  Worsening cough EXAM: CHEST - 2 VIEW COMPARISON:  10/05/2020 FINDINGS: Normal heart size and mediastinal contours. No acute infiltrate or edema. No effusion or pneumothorax. No acute osseous findings. Degenerative endplate spurring. IMPRESSION: No active cardiopulmonary disease. Electronically Signed   By: Jorje Guild M.D.   On: 02/05/2022 11:26    Procedures Procedures (including critical care time)  Medications Ordered in UC Medications  albuterol (VENTOLIN HFA) 108 (90 Base) MCG/ACT inhaler 2 puff (2 puffs Inhalation Given 02/05/22 1144)    Initial Impression / Assessment and Plan / UC Course  I have reviewed the triage vital signs and the nursing notes.  Pertinent labs & imaging results that were available during my care of the patient were reviewed by me and considered in  my medical decision making (see chart for details).     Patient is well-appearing, afebrile, nontoxic, nontachycardic.  No indication for viral testing as she has been symptomatic for a week.  X-ray was obtained that showed no acute cardiopulmonary disease.  Suspect bronchospasm/bronchitis as etiology of symptoms.  She was given albuterol in clinic with improvement of symptoms.  Recommend that she continue this medication every 4-6 hours for the next several days then decrease to as needed thereafter.  We will start prednisone burst 40 mg for 4 days.  Discussed that this can cause hyperglycemia and she is to avoid carbohydrates and drink plenty of fluid.  Also discussed that she should avoid NSAIDs with this medication.  She can use over-the-counter medication including Mucinex, Flonase.  Recommended that she rest and drink plenty of fluid.  She is to discontinue antibiotic prescribed at last visit.  If she has any chest pain, shortness of breath, worsening cough, fever, nausea, vomiting, weakness she needs to be seen immediately.  Strict return precautions given.  Work excuse note provided.  Final Clinical Impressions(s) / UC Diagnoses   Final diagnoses:  Acute bronchitis, unspecified organism     Discharge Instructions      Your x-ray was normal. Continue the albuterol every 4-6 hours for the next few days. Use Mucinex and Flonase over the counter. Start Prednisone. This will make your blood sugar high. Drink lots of water and avoid carbohydrates while on this medication.  You should also avoid NSAIDs including aspirin, ibuprofen/Advil, naproxen/Aleve.  You can stop your antibiotic.  You can continue Tessalon for cough.  If anything changes please return for reevaluation.     ED Prescriptions     Medication Sig Dispense Auth. Provider   predniSONE (DELTASONE) 20 MG tablet Take 2 tablets (  40 mg total) by mouth daily for 4 days. 8 tablet Vere Diantonio, Noberto Retort, PA-C      PDMP not reviewed this  encounter.   Jeani Hawking, PA-C 02/05/22 1215

## 2022-02-05 NOTE — ED Triage Notes (Signed)
Pt was seen on 02/01/2022. Pt is still coughing with no relief. Covid test negative.

## 2022-03-22 ENCOUNTER — Other Ambulatory Visit: Payer: Self-pay | Admitting: Obstetrics and Gynecology

## 2022-03-22 DIAGNOSIS — N6322 Unspecified lump in the left breast, upper inner quadrant: Secondary | ICD-10-CM

## 2022-04-07 ENCOUNTER — Ambulatory Visit
Admission: RE | Admit: 2022-04-07 | Discharge: 2022-04-07 | Disposition: A | Discharge status: Home / Self Care | Payer: 59 | Source: Ambulatory Visit | Attending: Obstetrics and Gynecology | Admitting: Obstetrics and Gynecology

## 2022-04-07 DIAGNOSIS — N6322 Unspecified lump in the left breast, upper inner quadrant: Secondary | ICD-10-CM

## 2024-01-25 ENCOUNTER — Other Ambulatory Visit: Payer: Self-pay

## 2024-01-25 ENCOUNTER — Ambulatory Visit (HOSPITAL_COMMUNITY)
Admission: EM | Admit: 2024-01-25 | Discharge: 2024-01-25 | Disposition: A | Discharge status: Home / Self Care | Attending: Family Medicine | Admitting: Family Medicine

## 2024-01-25 ENCOUNTER — Encounter (HOSPITAL_COMMUNITY): Payer: Self-pay | Admitting: Emergency Medicine

## 2024-01-25 DIAGNOSIS — R6889 Other general symptoms and signs: Secondary | ICD-10-CM

## 2024-01-25 DIAGNOSIS — J00 Acute nasopharyngitis [common cold]: Secondary | ICD-10-CM | POA: Diagnosis not present

## 2024-01-25 LAB — POC COVID19/FLU A&B COMBO
Covid Antigen, POC: NEGATIVE
Influenza A Antigen, POC: NEGATIVE
Influenza B Antigen, POC: NEGATIVE

## 2024-01-25 LAB — POCT RAPID STREP A (OFFICE): Rapid Strep A Screen: NEGATIVE

## 2024-01-25 MED ORDER — HYDROCODONE BIT-HOMATROP MBR 5-1.5 MG/5ML PO SOLN: 5.0000 mL | Freq: Four times a day (QID) | ORAL | 0 refills | Status: AC | PRN

## 2024-01-25 NOTE — Discharge Instructions (Addendum)
 Be aware, your cough medication may cause drowsiness. Please do not drive, operate heavy machinery or make important decisions while on this medication, it can cloud your judgement.  Results for orders placed or performed during the hospital encounter of 01/25/24  POC Covid19/Flu A&B Antigen   Collection Time: 01/25/24 10:14 AM  Result Value Ref Range   Influenza A Antigen, POC Negative Negative   Influenza B Antigen, POC Negative Negative   Covid Antigen, POC Negative Negative  POC rapid strep A   Collection Time: 01/25/24 10:40 AM  Result Value Ref Range   Rapid Strep A Screen Negative Negative

## 2024-01-25 NOTE — ED Provider Notes (Signed)
 [facility]   [id number] 01/25/24 Arrival Time: 9077  ASSESSMENT & PLAN:  1. Common cold   2. Not feeling great    Discussed likely viral illness; hopefully will improve within a few days. Results for orders placed or performed during the hospital encounter of 01/25/24  POC Covid19/Flu A&B Antigen   Collection Time: 01/25/24 10:14 AM  Result Value Ref Range   Influenza A Antigen, POC Negative Negative   Influenza B Antigen, POC Negative Negative   Covid Antigen, POC Negative Negative  POC rapid strep A   Collection Time: 01/25/24 10:40 AM  Result Value Ref Range   Rapid Strep A Screen Negative Negative   OTC symptom care as needed.  Meds ordered this encounter  Medications   HYDROcodone bit-homatropine (HYCODAN) 5-1.5 MG/5ML syrup    Sig: Take 5 mLs by mouth every 6 (six) hours as needed for cough.    Dispense:  60 mL    Refill:  0     Follow-up Information     [facility].   Specialty: Urgent Care Why: If worsening or failing to improve as anticipated. Contact information: [address] [place]  [address] [phone]                Reviewed expectations re: course of current medical issues. Questions answered. Outlined signs and symptoms indicating need for more acute intervention. Understanding verbalized. After Visit Summary given.   SUBJECTIVE: History from: Patient. [name] [name] is a 53 y.o. female. Reports a sore throat, stuffy nose and head congestion.  Denies fever. Symptom onset abrupt, approx 2-3 d ago. No tx PTA. Denies: difficulty breathing. Normal PO intake without n/v/d.  OBJECTIVE:  Vitals:   01/25/24 1002  BP: 122/77  Pulse: 80  Resp: 16  Temp: 97.9 F (36.6 C)  TempSrc: Oral  SpO2: 96%    General appearance: alert; no distress Eyes: PERRLA; EOMI; conjunctiva normal HENT: [place]; AT; with nasal congestion; throat with erythema/cobblestoning Neck: supple without  LAD Lungs: speaks full sentences without difficulty; unlabored Extremities: no edema Skin: warm and dry Neurologic: normal gait Psychological: alert and cooperative; normal mood and affect  Labs: Results for orders placed or performed during the hospital encounter of 01/25/24  POC Covid19/Flu A&B Antigen   Collection Time: 01/25/24 10:14 AM  Result Value Ref Range   Influenza A Antigen, POC Negative Negative   Influenza B Antigen, POC Negative Negative   Covid Antigen, POC Negative Negative  POC rapid strep A   Collection Time: 01/25/24 10:40 AM  Result Value Ref Range   Rapid Strep A Screen Negative Negative   Labs Reviewed  POC COVID19/FLU A&B COMBO  POCT RAPID STREP A (OFFICE)    Imaging: No results found.  Allergies  Allergen Reactions   Lisinopril  Swelling    Lip swelling Lip swelling     Past Medical History:  Diagnosis Date   Iron deficiency anemia    Menorrhagia    Type 2 diabetes mellitus (HCC)    Uterus, adenomyosis    Social History   Socioeconomic History   Marital status: Single    Spouse name: Not on file   Number of children: Not on file   Years of education: Not on file   Highest education level: Not on file  Occupational History   Not on file  Tobacco Use   Smoking status: Never   Smokeless tobacco: Never  Vaping Use   Vaping status: Never Used  Substance and Sexual Activity   Alcohol use: No   Drug use: No   Sexual activity: Not on file  Other Topics Concern   Not on file  Social History Narrative   Not on file   Social Drivers of Health   Financial Resource Strain: Not on file  Food Insecurity: Not on file  Transportation Needs: Not on file  Physical Activity: Not on file  Stress: Not on file  Social Connections: Not on file  Intimate Partner Violence: Not on file   Family History  Problem Relation Age of Onset   Breast cancer Maternal Aunt    Past Surgical History:  Procedure Laterality Date   CESAREAN SECTION   06-04-1998  &  10-11-2006   FOOT SURGERY Bilateral    LAPAROSCOPIC ASSISTED VAGINAL HYSTERECTOMY N/A 03/04/2014   Procedure: LAPAROSCOPIC ASSISTED VAGINAL HYSTERECTOMY;  Surgeon: Norleen GORMAN Skill, MD;  Location: [facility];  Service: Gynecology;  Laterality: N/A;  and abdomen ports     Rolinda Rogue, MD 01/25/24 1312

## 2024-01-25 NOTE — ED Triage Notes (Signed)
 Symptoms started Sunday.  Patient has a sore throat, stuffy nose and head congestion.  Denies fever  Patient has not had any medications for symptoms
